# Patient Record
Sex: Male | Born: 1970 | Race: White | Hispanic: No | Marital: Married | Smoking: Never smoker
Health system: Southern US, Community
[De-identification: ages and names within clinical notes are randomized; demographics above are authoritative.]

## PROBLEM LIST (undated history)

## (undated) DIAGNOSIS — R51 Headache: Secondary | ICD-10-CM

## (undated) DIAGNOSIS — E785 Hyperlipidemia, unspecified: Secondary | ICD-10-CM

## (undated) DIAGNOSIS — K219 Gastro-esophageal reflux disease without esophagitis: Secondary | ICD-10-CM

## (undated) DIAGNOSIS — R519 Headache, unspecified: Secondary | ICD-10-CM

## (undated) DIAGNOSIS — G43909 Migraine, unspecified, not intractable, without status migrainosus: Secondary | ICD-10-CM

## (undated) HISTORY — DX: Gastro-esophageal reflux disease without esophagitis: K21.9

## (undated) HISTORY — PX: NO PAST SURGERIES: SHX2092

## (undated) HISTORY — DX: Headache, unspecified: R51.9

## (undated) HISTORY — DX: Headache: R51

## (undated) HISTORY — DX: Hyperlipidemia, unspecified: E78.5

## (undated) HISTORY — DX: Migraine, unspecified, not intractable, without status migrainosus: G43.909

---

## 2005-08-25 ENCOUNTER — Encounter: Admission: RE | Admit: 2005-08-25 | Discharge: 2005-08-25 | Payer: Self-pay | Admitting: Family Medicine

## 2008-06-19 ENCOUNTER — Ambulatory Visit: Payer: Self-pay | Admitting: Diagnostic Radiology

## 2008-06-19 ENCOUNTER — Ambulatory Visit (HOSPITAL_BASED_OUTPATIENT_CLINIC_OR_DEPARTMENT_OTHER): Admission: RE | Admit: 2008-06-19 | Discharge: 2008-06-19 | Payer: Self-pay | Admitting: Family Medicine

## 2011-12-01 ENCOUNTER — Other Ambulatory Visit: Payer: Self-pay | Admitting: Otolaryngology

## 2011-12-01 ENCOUNTER — Ambulatory Visit
Admission: RE | Admit: 2011-12-01 | Discharge: 2011-12-01 | Disposition: A | Discharge status: Home / Self Care | Payer: BC Managed Care – PPO | Source: Ambulatory Visit | Attending: Otolaryngology | Admitting: Otolaryngology

## 2011-12-01 DIAGNOSIS — K219 Gastro-esophageal reflux disease without esophagitis: Secondary | ICD-10-CM

## 2013-10-22 IMAGING — CR DG CERVICAL SPINE COMPLETE 4+V
8 series · 8 of 8 positions shown · non-contrast
Comparison: None.

CLINICAL DATA: 40-year-old male with pain on the right side.

CERVICAL SPINE - COMPLETE 4+ VIEW

[view not recorded (1 of 8)]
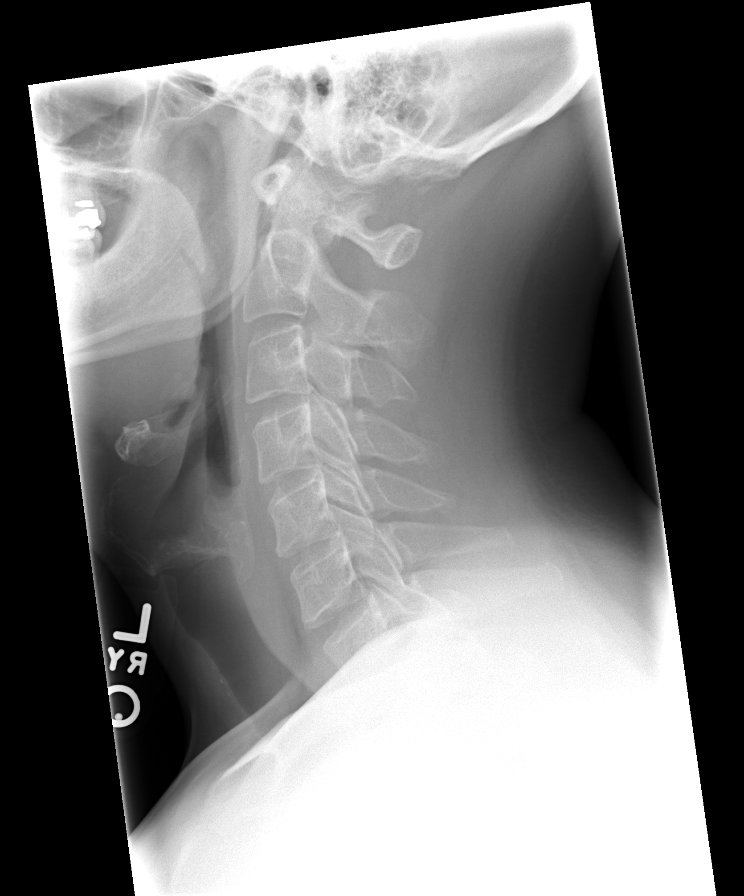

[view not recorded (2 of 8)]
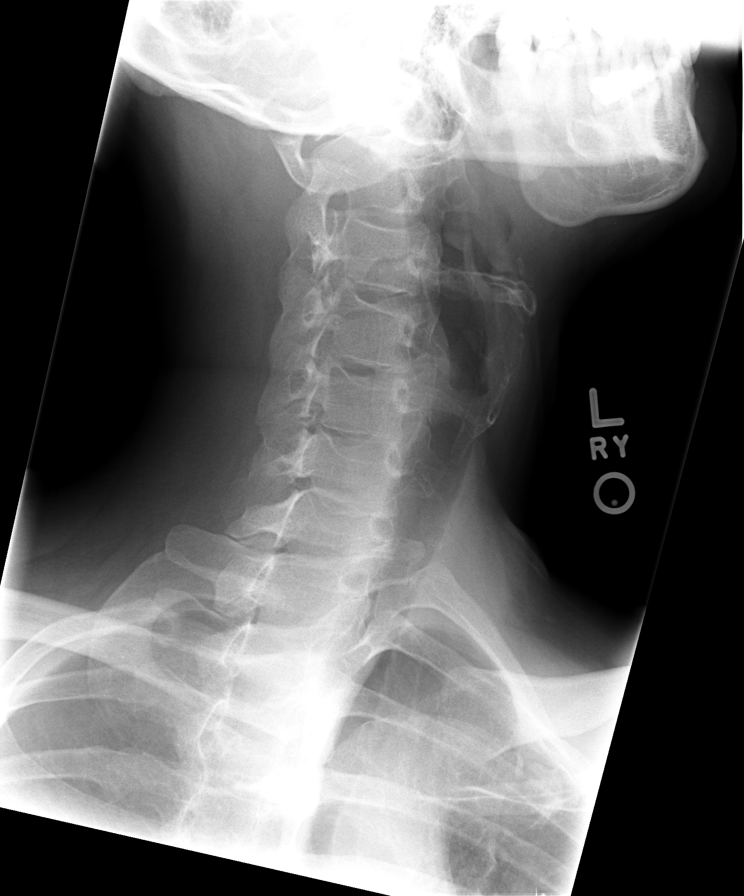

[view not recorded (3 of 8)]
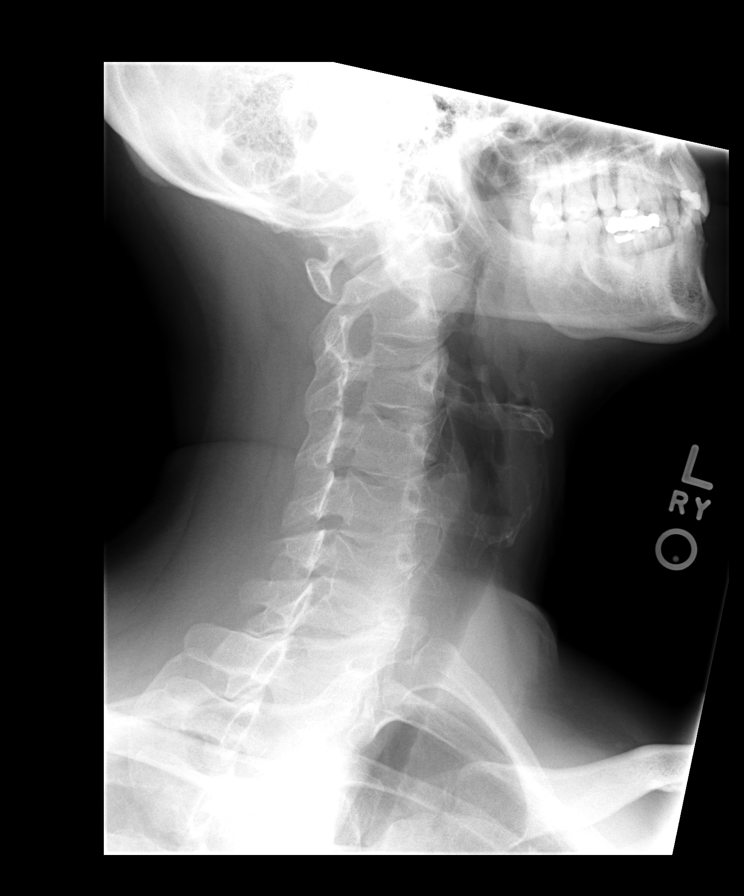

[view not recorded (4 of 8)]
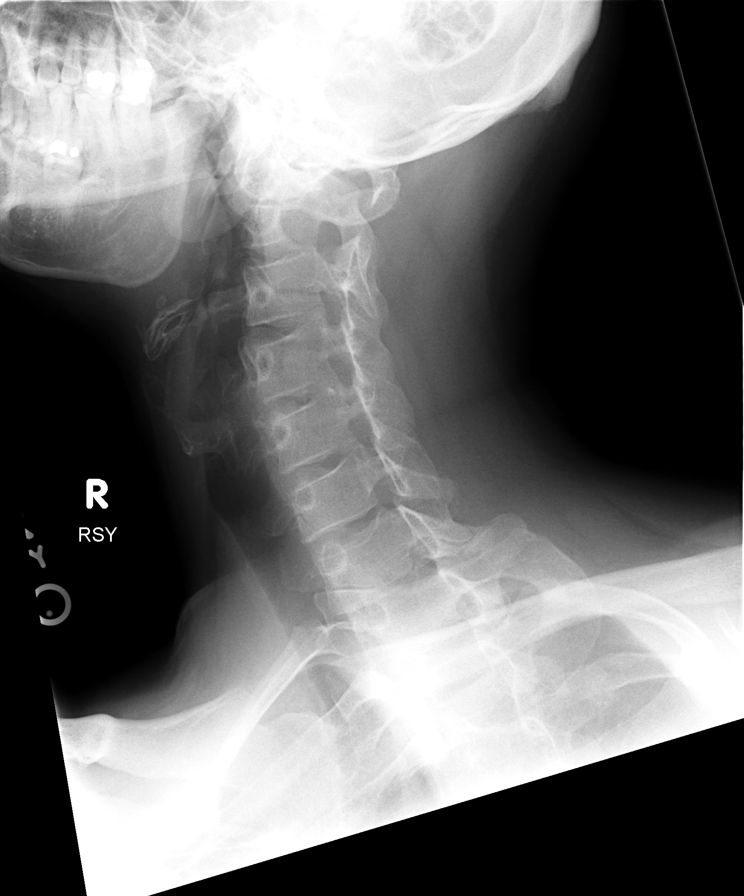

[view not recorded (5 of 8)]
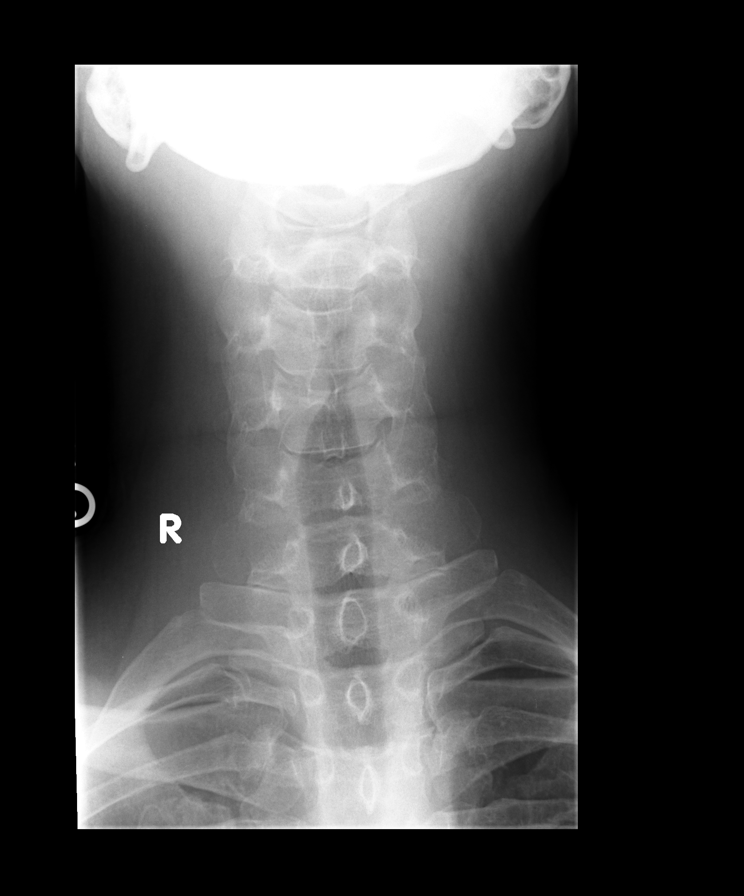

[view not recorded (6 of 8)]
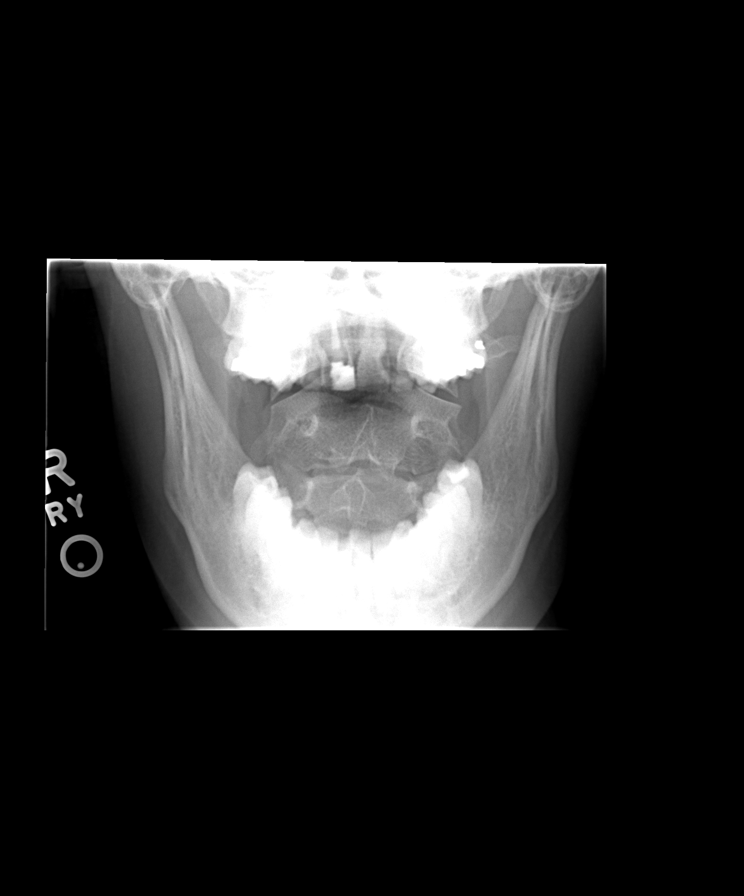

[view not recorded (7 of 8)]
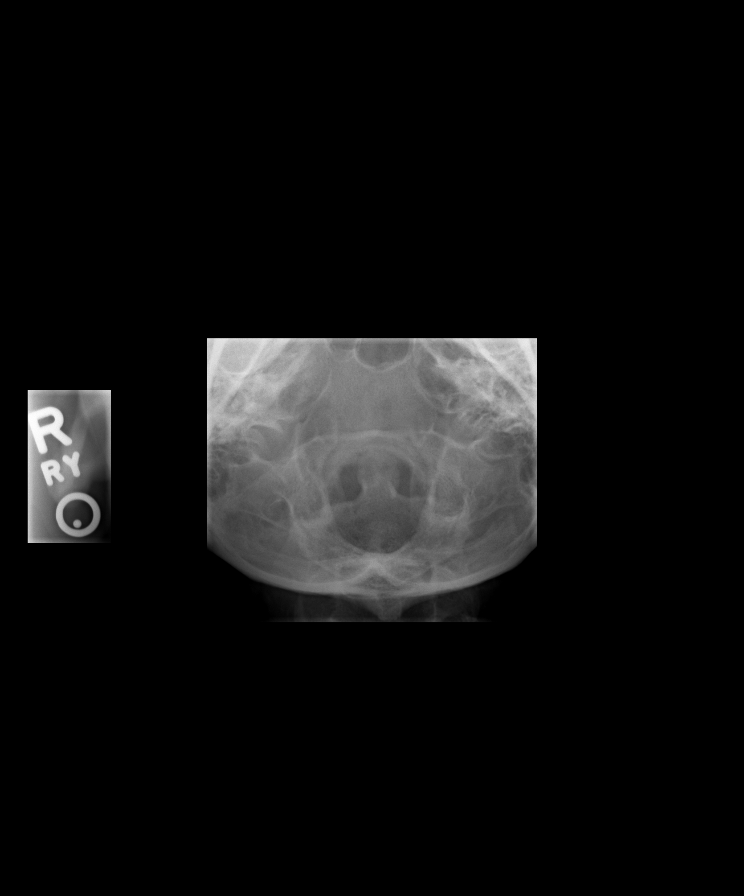

[view not recorded (8 of 8)]
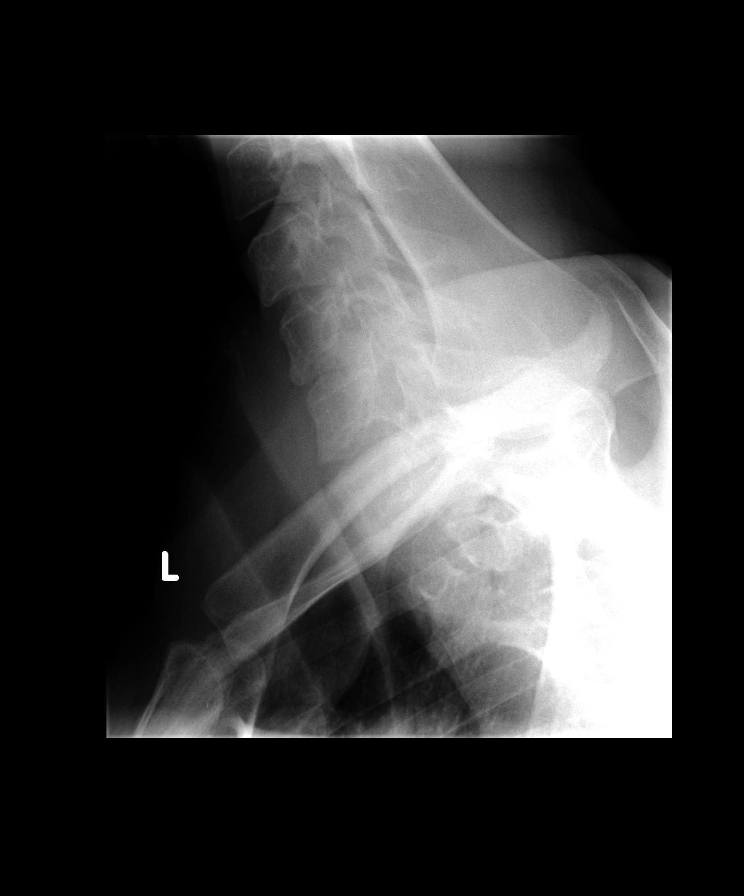

[8 of 8 positions shown; findings below may reference images not displayed]

FINDINGS: Preserved cervical lordosis.  Normal prevertebral soft
tissue contours.  Relatively preserved disc spaces. Bilateral
posterior element alignment is within normal limits.
Cervicothoracic junction alignment is within normal limits.  Lung
apices within normal limits.  AP alignment normal except for mild
leftward flexion of the upper cervical spine.  C1-C2 alignment and
odontoid within normal limits.
IMPRESSION: No acute osseous abnormality in the cervical spine.

## 2015-06-19 ENCOUNTER — Ambulatory Visit (HOSPITAL_COMMUNITY)
Admission: RE | Admit: 2015-06-19 | Discharge: 2015-06-19 | Disposition: A | Discharge status: Home / Self Care | Payer: BLUE CROSS/BLUE SHIELD | Source: Ambulatory Visit | Attending: Obstetrics and Gynecology | Admitting: Obstetrics and Gynecology

## 2015-06-19 DIAGNOSIS — Z31448 Encounter for other genetic testing of male for procreative management: Secondary | ICD-10-CM | POA: Diagnosis present

## 2015-06-19 LAB — ROUTINE CHROMOSOME - KARYOTYPE

## 2015-06-28 ENCOUNTER — Telehealth (HOSPITAL_COMMUNITY): Payer: Self-pay | Admitting: MS"

## 2015-06-28 NOTE — Telephone Encounter (Signed)
Peripheral blood chromosome analysis performed for Mr. Cesar Collier revealed apparently normal male chromosomes, 2746, XY. Testing performed given couple's pregnancy history. Results communicated to his wife, Mrs. Cesar Collier.   Cesar Collier 06/28/2015

## 2015-07-05 ENCOUNTER — Other Ambulatory Visit (HOSPITAL_COMMUNITY): Payer: Self-pay

## 2016-08-05 ENCOUNTER — Other Ambulatory Visit: Payer: Self-pay | Admitting: Orthopaedic Surgery

## 2016-08-05 DIAGNOSIS — Z77018 Contact with and (suspected) exposure to other hazardous metals: Secondary | ICD-10-CM

## 2016-08-05 DIAGNOSIS — M5136 Other intervertebral disc degeneration, lumbar region: Secondary | ICD-10-CM

## 2016-08-11 ENCOUNTER — Inpatient Hospital Stay
Admission: RE | Admit: 2016-08-11 | Discharge: 2016-08-11 | Disposition: A | Discharge status: Home / Self Care | Payer: BLUE CROSS/BLUE SHIELD | Source: Ambulatory Visit | Attending: Orthopaedic Surgery | Admitting: Orthopaedic Surgery

## 2016-08-11 ENCOUNTER — Other Ambulatory Visit: Payer: BLUE CROSS/BLUE SHIELD

## 2017-02-12 DIAGNOSIS — K5901 Slow transit constipation: Secondary | ICD-10-CM | POA: Diagnosis not present

## 2017-08-16 DIAGNOSIS — J101 Influenza due to other identified influenza virus with other respiratory manifestations: Secondary | ICD-10-CM | POA: Diagnosis not present

## 2017-08-16 DIAGNOSIS — R05 Cough: Secondary | ICD-10-CM | POA: Diagnosis not present

## 2017-08-16 DIAGNOSIS — J029 Acute pharyngitis, unspecified: Secondary | ICD-10-CM | POA: Diagnosis not present

## 2018-07-08 ENCOUNTER — Ambulatory Visit (INDEPENDENT_AMBULATORY_CARE_PROVIDER_SITE_OTHER): Payer: BLUE CROSS/BLUE SHIELD | Admitting: Family Medicine

## 2018-07-08 ENCOUNTER — Encounter: Payer: Self-pay | Admitting: Family Medicine

## 2018-07-08 VITALS — BP 120/80 | HR 74 | Temp 98.4°F | Ht 73.0 in | Wt 199.5 lb

## 2018-07-08 DIAGNOSIS — R0683 Snoring: Secondary | ICD-10-CM

## 2018-07-08 DIAGNOSIS — H9201 Otalgia, right ear: Secondary | ICD-10-CM

## 2018-07-08 DIAGNOSIS — Z0000 Encounter for general adult medical examination without abnormal findings: Secondary | ICD-10-CM

## 2018-07-08 DIAGNOSIS — K219 Gastro-esophageal reflux disease without esophagitis: Secondary | ICD-10-CM | POA: Diagnosis not present

## 2018-07-08 DIAGNOSIS — Z114 Encounter for screening for human immunodeficiency virus [HIV]: Secondary | ICD-10-CM

## 2018-07-08 LAB — COMPREHENSIVE METABOLIC PANEL
ALBUMIN: 4.2 g/dL (ref 3.5–5.2)
ALT: 31 U/L (ref 0–53)
AST: 24 U/L (ref 0–37)
Alkaline Phosphatase: 38 U/L — ABNORMAL LOW (ref 39–117)
BUN: 15 mg/dL (ref 6–23)
CALCIUM: 9 mg/dL (ref 8.4–10.5)
CHLORIDE: 104 meq/L (ref 96–112)
CO2: 29 meq/L (ref 19–32)
Creatinine, Ser: 0.95 mg/dL (ref 0.40–1.50)
GFR: 90.7 mL/min (ref >60.00)
Glucose, Bld: 92 mg/dL (ref 70–99)
Potassium: 4.1 mEq/L (ref 3.5–5.1)
SODIUM: 139 meq/L (ref 135–145)
Total Bilirubin: 0.5 mg/dL (ref 0.2–1.2)
Total Protein: 6.6 g/dL (ref 6.0–8.3)

## 2018-07-08 LAB — LIPID PANEL
CHOL/HDL RATIO: 4
CHOLESTEROL: 205 mg/dL — AB (ref 0–200)
HDL: 55.9 mg/dL (ref >39.00)
LDL CALC: 130 mg/dL — AB (ref 0–99)
NonHDL: 148.91
Triglycerides: 96 mg/dL (ref 0.0–149.0)
VLDL: 19.2 mg/dL (ref 0.0–40.0)

## 2018-07-08 MED ORDER — LEVOCETIRIZINE DIHYDROCHLORIDE 5 MG PO TABS: 5.0000 mg | ORAL_TABLET | Freq: Every evening | ORAL | 2 refills | Status: DC

## 2018-07-08 MED ORDER — MOMETASONE FUROATE 50 MCG/ACT NA SUSP: 2.0000 | Freq: Every day | NASAL | 12 refills | Status: DC

## 2018-07-08 NOTE — Progress Notes (Signed)
Pre visit review using our clinic review tool, if applicable. No additional management support is needed unless otherwise documented below in the visit note. 

## 2018-07-08 NOTE — Patient Instructions (Addendum)
Give us 2-3 business days to get the results of your labs back.   Keep the diet clean and stay active.  If you do not hear anything about your referral in the next 1-2 weeks, call our office and ask for an update.  Let us know if you need anything. 

## 2018-07-08 NOTE — Progress Notes (Signed)
Chief Complaint  Patient presents with  . New Patient (Initial Visit)    Well Male Cesar Collier is here for a complete physical.   His last physical was >1 year ago.  Current diet: in general, a "not horrible" diet.   Current exercise: Physically active at work Weight trend: lost 30 lbs Does pt snore? Yes. Daytime fatigue? Yes. Seat belt? Yes.    Health maintenance Tetanus- Yes HIV- No   +hx of snoring, not sig improved after losing 30 lbs. Wife usually kicks him out of bed, but there is concern that he has stopped breathing in his sleep. +daytime fatigue, but he is very busy with work.   +R ear pain, worse with cold weather. Has failed 2 mo of INCS. ENT initial eval was unremarkable, no helpful recs. Will sometimes have clear drainage on pillow. No bleeding. +congestion.  Past Medical History:  Diagnosis Date  . Headache   . Migraine      History reviewed. No pertinent surgical history.  Medications  Current Outpatient Medications on File Prior to Visit  Medication Sig Dispense Refill  . omeprazole (PRILOSEC) 20 MG capsule Take 20 mg by mouth daily.     Allergies No Known Allergies  Family History Family History  Problem Relation Age of Onset  . Diabetes Father   . Hypertension Father   . Alcohol abuse Maternal Grandfather   . Alzheimer's disease Paternal Grandmother   . Heart disease Paternal Grandfather   . Hyperlipidemia Paternal Grandfather    Review of Systems: Constitutional: no fevers or chills Eye:  no recent significant change in vision Ear/Nose/Mouth/Throat:  Ears:  +R ear pain Nose/Mouth/Throat:   no sore throat Cardiovascular:  no chest pain, no palpitations Respiratory:  no cough and no shortness of breath Gastrointestinal:  no abdominal pain, no change in bowel habits GU:  Male: negative for dysuria, frequency, and incontinence and negative for prostate symptoms Musculoskeletal/Extremities:  no pain, redness, or swelling of the  joints Integumentary (Skin/Breast): +dry patch; otherwise no abnormal skin lesions reported Neurologic:  no headaches, no numbness, tingling Endocrine: No unexpected weight changes Hematologic/Lymphatic:  no night sweats  Exam BP 120/80 (BP Location: Right Arm, Patient Position: Sitting, Cuff Size: Normal)   Pulse 74   Temp 98.4 F (36.9 C) (Oral)   Ht 6\' 1"  (1.854 m)   Wt 199 lb 8 oz (90.5 kg)   SpO2 98%   BMI 26.32 kg/m  General:  well developed, well nourished, in no apparent distress Skin: See below; otherwise no significant moles, warts, or growths Head:  no masses, lesions, or tenderness Eyes:  pupils equal and round, sclera anicteric without injection Ears:  canals without lesions, TMs shiny without retraction, no obvious effusion, no erythema Nose:  nares patent, septum midline, mucosa normal Throat/Pharynx:  lips and gingiva without lesion; tongue and uvula midline; non-inflamed pharynx; no exudates or postnasal drainage Neck: neck supple without adenopathy, thyromegaly, or masses Lungs:  clear to auscultation, breath sounds equal bilaterally, no respiratory distress Cardio:  regular rate and rhythm, no bruits, no LE edema Abdomen:  abdomen soft, nontender; bowel sounds normal; no masses or organomegaly Genital (male): Uncircumcised penis, no lesions or discharge; testes present bilaterally without masses or tenderness Rectal: Deferred Musculoskeletal:  symmetrical muscle groups noted without atrophy or deformity Extremities:  no clubbing, cyanosis, or edema, no deformities, no skin discoloration Neuro:  gait normal; deep tendon reflexes normal and symmetric Psych: well oriented with normal range of affect and appropriate judgment/insight  L collar bone  Assessment and Plan  Well adult exam - Plan: Comprehensive metabolic panel, Lipid panel  Gastroesophageal reflux disease, esophagitis presence not specified  Snoring - Plan: Ambulatory referral to  Pulmonology  Screening for HIV (human immunodeficiency virus) - Plan: HIV Antibody (routine testing w rflx)  Right ear pain   Well 48 y.o. male. Counseled on diet and exercise. Other orders as above. Refer to pulm/sleep for snoring/possible OSA. Trial INCS and PO antihistamine. If no improvement, will refer to ENT in area.  Monitor skin lesion. Will let us know if it changes. Follow up in 1 year pending the above workup. The patient voiced understanding and agreement to the plan.  Jilda Rocheicholas Paul AristesWendling, DO 07/08/18 1:49 PM

## 2018-07-09 LAB — HIV ANTIBODY (ROUTINE TESTING W REFLEX): HIV: NONREACTIVE

## 2018-10-13 ENCOUNTER — Institutional Professional Consult (permissible substitution): Payer: BLUE CROSS/BLUE SHIELD | Admitting: Pulmonary Disease

## 2019-07-12 ENCOUNTER — Encounter: Payer: BLUE CROSS/BLUE SHIELD | Admitting: Family Medicine

## 2019-07-19 ENCOUNTER — Encounter: Payer: BLUE CROSS/BLUE SHIELD | Admitting: Family Medicine

## 2019-08-28 ENCOUNTER — Encounter: Payer: Self-pay | Admitting: Internal Medicine

## 2019-08-28 ENCOUNTER — Other Ambulatory Visit: Payer: Self-pay

## 2019-08-28 ENCOUNTER — Ambulatory Visit: Payer: BC Managed Care – PPO | Admitting: Internal Medicine

## 2019-08-28 VITALS — BP 134/81 | HR 77 | Temp 96.8°F | Resp 16 | Ht 73.0 in | Wt 208.0 lb

## 2019-08-28 DIAGNOSIS — M542 Cervicalgia: Secondary | ICD-10-CM

## 2019-08-28 MED ORDER — PREDNISONE 10 MG PO TABS: ORAL_TABLET | ORAL | 0 refills | Status: DC

## 2019-08-28 MED ORDER — CYCLOBENZAPRINE HCL 10 MG PO TABS
10.0000 mg | ORAL_TABLET | Freq: Every evening | ORAL | 0 refills | Status: DC | PRN
Start: 2019-08-28 — End: 2021-07-22

## 2019-08-28 NOTE — Progress Notes (Signed)
   Subjective:    Patient ID: Cesar Collier, male    DOB: 25-Sep-1970, 49 y.o.   MRN: [id number]  DOS:  08/28/2019 Type of visit - description: Acute Symptoms started approximately a week ago with pain located at the right posterior neck. The pain is steady but worse with certain movements. He feels that is radiating upwards creating a right-sided headache. He also has some sinus pressure but denies any fever, chills, nasal discharge. Has taking OTCs without much success. No recent injury that he can recall.   Review of Systems Denies any rash on the head or neck No dizziness, nausea, diplopia.  Speech is normal.  No visual disturbances No cough  Past Medical History:  Diagnosis Date  . Headache   . Migraine     No past surgical history on file.  Allergies as of 08/28/2019      Reactions   Codeine Nausea And Vomiting      Medication List       Accurate as of August 28, 2019 11:44 AM. If you have any questions, ask your nurse or doctor.        levocetirizine 5 MG tablet Commonly known as: XYZAL Take 1 tablet (5 mg total) by mouth every evening.   mometasone 50 MCG/ACT nasal spray Commonly known as: NASONEX Place 2 sprays into the nose daily.   omeprazole 20 MG capsule Commonly known as: PRILOSEC Take 20 mg by mouth daily.             Objective:   Physical Exam Neck:     BP 134/81 (BP Location: Right Arm, Patient Position: Sitting, Cuff Size: Normal)   Pulse 77   Temp (!) 96.8 F (36 C) (Temporal)   Resp 16   Ht 6\' 1"  (1.854 m)   Wt 208 lb (94.3 kg)   SpO2 100%   BMI 27.44 kg/m  General:   Well developed, NAD, BMI noted. HEENT:  Normocephalic . Face symmetric, atraumatic. EOMI, pupils equal and reactive, no photophobia, anterior chambers normal. Neck: See graphic, range of motion is normal but he reports pain when he turns his head to the right. Lungs:  CTA B Normal respiratory effort, no intercostal retractions, no accessory muscle  use. Heart: RRR,  no murmur.  Lower extremities: no pretibial edema bilaterally  Skin: Not pale. Not jaundice Neurologic:  alert & oriented X3.  Speech normal, gait appropriate for age and unassisted Motor and DTRs symmetric Psych--  Cognition and judgment appear intact.  Cooperative with normal attention span and concentration.  Behavior appropriate. No anxious or depressed appearing.      Assessment    49 year old male, PMH includes allergies and GERD presents with:  Neck pain: Neck pain associated with likely a cervicogenic headache. No alarming or red flag symptoms. Exam is benign. Plan: Round of prednisone, Flexeril at night, alternate Tylenol and ibuprofen, GI precautions discussed. Call if not gradually better.   This visit occurred during the SARS-CoV-2 public health emergency.  Safety protocols were in place, including screening questions prior to the visit, additional usage of staff PPE, and extensive cleaning of exam room while observing appropriate contact time as indicated for disinfecting solutions.

## 2019-08-28 NOTE — Progress Notes (Signed)
Pre visit review using our clinic review tool, if applicable. No additional management support is needed unless otherwise documented below in the visit note. 

## 2019-08-28 NOTE — Patient Instructions (Signed)
Take prednisone, and anti-inflammatory as prescribed for few days. May cause insomnia  Flexeril, a muscle relaxant, as needed at bedtime  Heating pad  Alternate ibuprofen and Tylenol  IBUPROFEN : Always take it with food because may cause gastritis and ulcers.  If you notice nausea, stomach pain, change in the color of stools --->  Stop the medicine and let us know  Call if not gradually better  Call anytime if severe symptoms,   numbness or tingling anywhere or if you're not gradually better in the next 10 days

## 2019-11-03 ENCOUNTER — Ambulatory Visit: Payer: BC Managed Care – PPO | Admitting: Family Medicine

## 2019-11-03 ENCOUNTER — Encounter: Payer: Self-pay | Admitting: Family Medicine

## 2019-11-03 ENCOUNTER — Other Ambulatory Visit (INDEPENDENT_AMBULATORY_CARE_PROVIDER_SITE_OTHER): Payer: BC Managed Care – PPO

## 2019-11-03 ENCOUNTER — Other Ambulatory Visit: Payer: Self-pay | Admitting: Family Medicine

## 2019-11-03 ENCOUNTER — Other Ambulatory Visit: Payer: Self-pay

## 2019-11-03 VITALS — BP 128/72 | HR 88 | Temp 95.7°F | Ht 73.0 in | Wt 202.5 lb

## 2019-11-03 DIAGNOSIS — R7989 Other specified abnormal findings of blood chemistry: Secondary | ICD-10-CM | POA: Diagnosis not present

## 2019-11-03 DIAGNOSIS — R0683 Snoring: Secondary | ICD-10-CM | POA: Diagnosis not present

## 2019-11-03 DIAGNOSIS — E785 Hyperlipidemia, unspecified: Secondary | ICD-10-CM

## 2019-11-03 DIAGNOSIS — R739 Hyperglycemia, unspecified: Secondary | ICD-10-CM | POA: Diagnosis not present

## 2019-11-03 DIAGNOSIS — G8929 Other chronic pain: Secondary | ICD-10-CM

## 2019-11-03 DIAGNOSIS — R29898 Other symptoms and signs involving the musculoskeletal system: Secondary | ICD-10-CM

## 2019-11-03 DIAGNOSIS — M545 Low back pain, unspecified: Secondary | ICD-10-CM

## 2019-11-03 DIAGNOSIS — Z0000 Encounter for general adult medical examination without abnormal findings: Secondary | ICD-10-CM

## 2019-11-03 LAB — COMPREHENSIVE METABOLIC PANEL
ALT: 38 U/L (ref 0–53)
AST: 24 U/L (ref 0–37)
Albumin: 4.3 g/dL (ref 3.5–5.2)
Alkaline Phosphatase: 39 U/L (ref 39–117)
BUN: 13 mg/dL (ref 6–23)
CO2: 32 mEq/L (ref 19–32)
Calcium: 9.1 mg/dL (ref 8.4–10.5)
Chloride: 103 mEq/L (ref 96–112)
Creatinine, Ser: 1.01 mg/dL (ref 0.40–1.50)
GFR: 79.06 mL/min (ref >60.00)
Glucose, Bld: 108 mg/dL — ABNORMAL HIGH (ref 70–99)
Potassium: 4 mEq/L (ref 3.5–5.1)
Sodium: 140 mEq/L (ref 135–145)
Total Bilirubin: 0.5 mg/dL (ref 0.2–1.2)
Total Protein: 6.7 g/dL (ref 6.0–8.3)

## 2019-11-03 LAB — CBC
HCT: 41.9 % (ref 39.0–52.0)
Hemoglobin: 14.3 g/dL (ref 13.0–17.0)
MCHC: 34.1 g/dL (ref 30.0–36.0)
MCV: 91.6 fl (ref 78.0–100.0)
Platelets: 198 10*3/uL (ref 150.0–400.0)
RBC: 4.57 Mil/uL (ref 4.22–5.81)
RDW: 13 % (ref 11.5–15.5)
WBC: 4.9 10*3/uL (ref 4.0–10.5)

## 2019-11-03 LAB — LIPID PANEL
Cholesterol: 216 mg/dL — ABNORMAL HIGH (ref 0–200)
HDL: 47.5 mg/dL (ref >39.00)
LDL Cholesterol: 130 mg/dL — ABNORMAL HIGH (ref 0–99)
NonHDL: 168.3
Total CHOL/HDL Ratio: 5
Triglycerides: 193 mg/dL — ABNORMAL HIGH (ref 0.0–149.0)
VLDL: 38.6 mg/dL (ref 0.0–40.0)

## 2019-11-03 LAB — HEMOGLOBIN A1C: Hgb A1c MFr Bld: 5.6 % (ref 4.6–6.5)

## 2019-11-03 NOTE — Patient Instructions (Addendum)
Give [place] 2-3 business days to get the results of your labs back.   Keep the diet clean and stay active.  If you do not hear anything about your referral in the next 1-2 weeks, call our office and ask for an update.  Let us know if you need anything.  EXERCISES  RANGE OF MOTION (ROM) AND STRETCHING EXERCISES - Low Back Pain Most people with lower back pain will find that their symptoms get worse with excessive bending forward (flexion) or arching at the lower back (extension). The exercises that will help resolve your symptoms will focus on the opposite motion.  If you have pain, numbness or tingling which travels down into your buttocks, leg or foot, the goal of the therapy is for these symptoms to move closer to your back and eventually resolve. Sometimes, these leg symptoms will get better, but your lower back pain may worsen. This is often an indication of progress in your rehabilitation. Be very alert to any changes in your symptoms and the activities in which you participated in the 24 hours prior to the change. Sharing this information with your caregiver will allow him or her to most efficiently treat your condition. These exercises may help you when beginning to rehabilitate your injury. Your symptoms may resolve with or without further involvement from your physician, physical therapist or athletic trainer. While completing these exercises, remember:   Restoring tissue flexibility helps normal motion to return to the joints. This allows healthier, less painful movement and activity.  An effective stretch should be held for at least 30 seconds.  A stretch should never be painful. You should only feel a gentle lengthening or release in the stretched tissue. FLEXION RANGE OF MOTION AND STRETCHING EXERCISES:  STRETCH - Flexion, Single Knee to Chest   Lie on a firm bed or floor with both legs extended in front of you.  Keeping one leg in contact with the floor, bring your opposite knee to  your chest. Hold your leg in place by either grabbing behind your thigh or at your knee.  Pull until you feel a gentle stretch in your low back. Hold 30 seconds.  Slowly release your grasp and repeat the exercise with the opposite side. Repeat 2 times. Complete this exercise 3 times per week.   STRETCH - Flexion, Double Knee to Chest  Lie on a firm bed or floor with both legs extended in front of you.  Keeping one leg in contact with the floor, bring your opposite knee to your chest.  Tense your stomach muscles to support your back and then lift your other knee to your chest. Hold your legs in place by either grabbing behind your thighs or at your knees.  Pull both knees toward your chest until you feel a gentle stretch in your low back. Hold 30 seconds.  Tense your stomach muscles and slowly return one leg at a time to the floor. Repeat 2 times. Complete this exercise 3 times per week.   STRETCH - Low Trunk Rotation  Lie on a firm bed or floor. Keeping your legs in front of you, bend your knees so they are both pointed toward the ceiling and your feet are flat on the floor.  Extend your arms out to the side. This will stabilize your upper body by keeping your shoulders in contact with the floor.  Gently and slowly drop both knees together to one side until you feel a gentle stretch in your low back. Hold  for 30 seconds.  Tense your stomach muscles to support your lower back as you bring your knees back to the starting position. Repeat the exercise to the other side. Repeat 2 times. Complete this exercise at least 3 times per week.   EXTENSION RANGE OF MOTION AND FLEXIBILITY EXERCISES:  STRETCH - Extension, Prone on Elbows   Lie on your stomach on the floor, a bed will be too soft. Place your palms about shoulder width apart and at the height of your head.  Place your elbows under your shoulders. If this is too painful, stack pillows under your chest.  Allow your body to relax  so that your hips drop lower and make contact more completely with the floor.  Hold this position for 30 seconds.  Slowly return to lying flat on the floor. Repeat 2 times. Complete this exercise 3 times per week.   RANGE OF MOTION - Extension, Prone Press Ups  Lie on your stomach on the floor, a bed will be too soft. Place your palms about shoulder width apart and at the height of your head.  Keeping your back as relaxed as possible, slowly straighten your elbows while keeping your hips on the floor. You may adjust the placement of your hands to maximize your comfort. As you gain motion, your hands will come more underneath your shoulders.  Hold this position 30 seconds.  Slowly return to lying flat on the floor. Repeat 2 times. Complete this exercise 3 times per week.   RANGE OF MOTION- Quadruped, Neutral Spine   Assume a hands and knees position on a firm surface. Keep your hands under your shoulders and your knees under your hips. You may place padding under your knees for comfort.  Drop your head and point your tailbone toward the ground below you. This will round out your lower back like an angry cat. Hold this position for 30 seconds.  Slowly lift your head and release your tail bone so that your back sags into a large arch, like an old horse.  Hold this position for 30 seconds.  Repeat this until you feel limber in your low back.  Now, find your "sweet spot." This will be the most comfortable position somewhere between the two previous positions. This is your neutral spine. Once you have found this position, tense your stomach muscles to support your low back.  Hold this position for 30 seconds. Repeat 2 times. Complete this exercise 3 times per week.   STRENGTHENING EXERCISES - Low Back Sprain These exercises may help you when beginning to rehabilitate your injury. These exercises should be done near your "sweet spot." This is the neutral, low-back arch, somewhere between  fully rounded and fully arched, that is your least painful position. When performed in this safe range of motion, these exercises can be used for people who have either a flexion or extension based injury. These exercises may resolve your symptoms with or without further involvement from your physician, physical therapist or athletic trainer. While completing these exercises, remember:   Muscles can gain both the endurance and the strength needed for everyday activities through controlled exercises.  Complete these exercises as instructed by your physician, physical therapist or athletic trainer. Increase the resistance and repetitions only as guided.  You may experience muscle soreness or fatigue, but the pain or discomfort you are trying to eliminate should never worsen during these exercises. If this pain does worsen, stop and make certain you are following the directions exactly.  If the pain is still present after adjustments, discontinue the exercise until you can discuss the trouble with your caregiver.  STRENGTHENING - Deep Abdominals, Pelvic Tilt   Lie on a firm bed or floor. Keeping your legs in front of you, bend your knees so they are both pointed toward the ceiling and your feet are flat on the floor.  Tense your lower abdominal muscles to press your low back into the floor. This motion will rotate your pelvis so that your tail bone is scooping upwards rather than pointing at your feet or into the floor. With a gentle tension and even breathing, hold this position for 3 seconds. Repeat 2 times. Complete this exercise 3 times per week.   STRENGTHENING - Abdominals, Crunches   Lie on a firm bed or floor. Keeping your legs in front of you, bend your knees so they are both pointed toward the ceiling and your feet are flat on the floor. Cross your arms over your chest.  Slightly tip your chin down without bending your neck.  Tense your abdominals and slowly lift your trunk high enough to  just clear your shoulder blades. Lifting higher can put excessive stress on the lower back and does not further strengthen your abdominal muscles.  Control your return to the starting position. Repeat 2 times. Complete this exercise 3 times per week.   STRENGTHENING - Quadruped, Opposite UE/LE Lift   Assume a hands and knees position on a firm surface. Keep your hands under your shoulders and your knees under your hips. You may place padding under your knees for comfort.  Find your neutral spine and gently tense your abdominal muscles so that you can maintain this position. Your shoulders and hips should form a rectangle that is parallel with the floor and is not twisted.  Keeping your trunk steady, lift your right hand no higher than your shoulder and then your left leg no higher than your hip. Make sure you are not holding your breath. Hold this position for 30 seconds.  Continuing to keep your abdominal muscles tense and your back steady, slowly return to your starting position. Repeat with the opposite arm and leg. Repeat 2 times. Complete this exercise 3 times per week.   STRENGTHENING - Abdominals and Quadriceps, Straight Leg Raise   Lie on a firm bed or floor with both legs extended in front of you.  Keeping one leg in contact with the floor, bend the other knee so that your foot can rest flat on the floor.  Find your neutral spine, and tense your abdominal muscles to maintain your spinal position throughout the exercise.  Slowly lift your straight leg off the floor about 6 inches for a count of 3, making sure to not hold your breath.  Still keeping your neutral spine, slowly lower your leg all the way to the floor. Repeat this exercise with each leg 2 times. Complete this exercise 3 times per week.  POSTURE AND BODY MECHANICS CONSIDERATIONS - Low Back Sprain Keeping correct posture when sitting, standing or completing your activities will reduce the stress put on different body  tissues, allowing injured tissues a chance to heal and limiting painful experiences. The following are general guidelines for improved posture.  While reading these guidelines, remember:  The exercises prescribed by your provider will help you have the flexibility and strength to maintain correct postures.  The correct posture provides the best environment for your joints to work. All of your joints have less  wear and tear when properly supported by a spine with good posture. This means you will experience a healthier, less painful body.  Correct posture must be practiced with all of your activities, especially prolonged sitting and standing. Correct posture is as important when doing repetitive low-stress activities (typing) as it is when doing a single heavy-load activity (lifting).  RESTING POSITIONS Consider which positions are most painful for you when choosing a resting position. If you have pain with flexion-based activities (sitting, bending, stooping, squatting), choose a position that allows you to rest in a less flexed posture. You would want to avoid curling into a fetal position on your side. If your pain worsens with extension-based activities (prolonged standing, working overhead), avoid resting in an extended position such as sleeping on your stomach. Most people will find more comfort when they rest with their spine in a more neutral position, neither too rounded nor too arched. Lying on a non-sagging bed on your side with a pillow between your knees, or on your back with a pillow under your knees will often provide some relief. Keep in mind, being in any one position for a prolonged period of time, no matter how correct your posture, can still lead to stiffness.  PROPER SITTING POSTURE In order to minimize stress and discomfort on your spine, you must sit with correct posture. Sitting with good posture should be effortless for a healthy body. Returning to good posture is a gradual  process. Many people can work toward this most comfortably by using various supports until they have the flexibility and strength to maintain this posture on their own. When sitting with proper posture, your ears will fall over your shoulders and your shoulders will fall over your hips. You should use the back of the chair to support your upper back. Your lower back will be in a neutral position, just slightly arched. You may place a small pillow or folded towel at the base of your lower back for  support.  When working at a desk, create an environment that supports good, upright posture. Without extra support, muscles tire, which leads to excessive strain on joints and other tissues. Keep these recommendations in mind:  CHAIR:  A chair should be able to slide under your desk when your back makes contact with the back of the chair. This allows you to work closely.  The chair's height should allow your eyes to be level with the upper part of your monitor and your hands to be slightly lower than your elbows.  BODY POSITION  Your feet should make contact with the floor. If this is not possible, use a foot rest.  Keep your ears over your shoulders. This will reduce stress on your neck and low back.  INCORRECT SITTING POSTURES  If you are feeling tired and unable to assume a healthy sitting posture, do not slouch or slump. This puts excessive strain on your back tissues, causing more damage and pain. Healthier options include:  Using more support, like a lumbar pillow.  Switching tasks to something that requires you to be upright or walking.  Talking a brief walk.  Lying down to rest in a neutral-spine position.  PROLONGED STANDING WHILE SLIGHTLY LEANING FORWARD  When completing a task that requires you to lean forward while standing in one place for a long time, place either foot up on a stationary 2-4 inch high object to help maintain the best posture. When both feet are on the ground, the  lower  back tends to lose its slight inward curve. If this curve flattens (or becomes too large), then the back and your other joints will experience too much stress, tire more quickly, and can cause pain.  CORRECT STANDING POSTURES Proper standing posture should be assumed with all daily activities, even if they only take a few moments, like when brushing your teeth. As in sitting, your ears should fall over your shoulders and your shoulders should fall over your hips. You should keep a slight tension in your abdominal muscles to brace your spine. Your tailbone should point down to the ground, not behind your body, resulting in an over-extended swayback posture.   INCORRECT STANDING POSTURES  Common incorrect standing postures include a forward head, locked knees and/or an excessive swayback. WALKING Walk with an upright posture. Your ears, shoulders and hips should all line-up.  PROLONGED ACTIVITY IN A FLEXED POSITION When completing a task that requires you to bend forward at your waist or lean over a low surface, try to find a way to stabilize 3 out of 4 of your limbs. You can place a hand or elbow on your thigh or rest a knee on the surface you are reaching across. This will provide you more stability, so that your muscles do not tire as quickly. By keeping your knees relaxed, or slightly bent, you will also reduce stress across your lower back. CORRECT LIFTING TECHNIQUES  DO :  Assume a wide stance. This will provide you more stability and the opportunity to get as close as possible to the object which you are lifting.  Tense your abdominals to brace your spine. Bend at the knees and hips. Keeping your back locked in a neutral-spine position, lift using your leg muscles. Lift with your legs, keeping your back straight.  Test the weight of unknown objects before attempting to lift them.  Try to keep your elbows locked down at your sides in order get the best strength from your shoulders  when carrying an object.     Always ask for help when lifting heavy or awkward objects. INCORRECT LIFTING TECHNIQUES DO NOT:   Lock your knees when lifting, even if it is a small object.  Bend and twist. Pivot at your feet or move your feet when needing to change directions.  Assume that you can safely pick up even a paperclip without proper posture.

## 2019-11-03 NOTE — Progress Notes (Signed)
Chief Complaint  Patient presents with  . Annual Exam    needs referral for back and leg problems    Well Male Cesar Collier [id number] is here for a complete physical.   His last physical was >1 year ago.  Current diet: in general, a "healthy" diet.   Current exercise: swimming, active in yard, walking Weight trend: stable Daytime fatigue? No. Seat belt? Yes.    Health maintenance Tetanus- Yes HIV- Yes   Patient has a nearly 15-year history of chronic right lower back pain with radiation to his right lower extremity.  He has been having a sharp pain in his upper gluteal area with radiation down his leg.  He was also having numbness and weakness in this leg as well.  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  He has undergone physical therapy in the past.  He states MRI was too expensive during his most recent work-up.  He would like to see a specialist.  No bowel or bladder incontinence.  Past Medical History:  Diagnosis Date  . Headache   . Migraine     History reviewed. No pertinent surgical history.  Medications  Current Outpatient Medications on File Prior to Visit  Medication Sig Dispense Refill  . omeprazole (PRILOSEC) 20 MG capsule Take 20 mg by mouth daily.    . cyclobenzaprine (FLEXERIL) 10 MG tablet Take 1 tablet (10 mg total) by mouth at bedtime as needed for muscle spasms. (Patient not taking: Reported on 11/03/2019) 14 tablet 0   Allergies Allergies  Allergen Reactions  . Codeine Nausea And Vomiting    Family History Family History  Problem Relation Age of Onset  . Diabetes Father   . Hypertension Father   . Alcohol abuse Maternal Grandfather   . Alzheimer's disease Paternal Grandmother   . Heart disease Paternal Grandfather   . Hyperlipidemia Paternal Grandfather     Review of Systems: Constitutional: no fevers or chills Eye:  no recent significant change in  vision Ear/Nose/Mouth/Throat:  Ears:  no hearing loss Nose/Mouth/Throat:  no complaints of nasal congestion, no sore throat Cardiovascular:  no chest pain Respiratory:  no shortness of breath Gastrointestinal:  no abdominal pain, no change in bowel habits GU:  Male: negative for dysuria, frequency, and incontinence Musculoskeletal/Extremities:  + Back and right lower extremity Integumentary (Skin/Breast):  no abnormal skin lesions reported Neurologic:  + Right lower extremity weakness Endocrine: No unexpected weight changes Hematologic/Lymphatic:  no night sweats  Exam BP 128/72 (BP Location: Left Arm, Patient Position: Sitting, Cuff Size: Normal)   Pulse 88   Temp (!) 95.7 F (35.4 C) (Temporal)   Ht 6\' 1"  (1.854 m)   Wt 202 lb 8 oz (91.9 kg)   SpO2 96%   BMI 26.72 kg/m  General:  well developed, well nourished, in no apparent distress Skin:  no significant moles, warts, or growths Head:  no masses, lesions, or tenderness Eyes:  pupils equal and round, sclera anicteric without injection Ears:  canals without lesions, TMs shiny without retraction, no obvious effusion, no erythema Nose:  nares patent, septum midline, mucosa normal Throat/Pharynx:  lips and gingiva without lesion; tongue and uvula midline; non-inflamed pharynx; no exudates or postnasal drainage Neck: neck supple without adenopathy, thyromegaly, or masses Lungs:  clear to auscultation, breath sounds equal bilaterally, no respiratory distress Cardio:  regular rate and rhythm, no bruits, no LE edema Abdomen:  abdomen soft, nontender; bowel  sounds normal; no masses or organomegaly Rectal: Deferred Musculoskeletal:  Mild ttp over the glute med on R; no ttp over midline or parasp msc b/l in lumbar spine Extremities:  no clubbing, cyanosis, or edema, no deformities, no skin discoloration Neuro:  gait antalgic; deep tendon reflexes brisk though symmetric and wo clonus Psych: well oriented with normal range of affect and  appropriate judgment/insight  Assessment and Plan  Well adult exam - Plan: CBC, Comprehensive metabolic panel, Lipid panel  Chronic right-sided low back pain, unspecified whether sciatica present - Plan: Ambulatory referral to Neurosurgery  Right leg weakness - Plan: Ambulatory referral to Neurosurgery   Well 49 y.o. male. Counseled on diet and exercise. Counseled on risks and benefits of prostate cancer screening with PSA. The patient agrees to forego screening.  Other orders as above. Declined PT/MRI, preferred referral. NS will be able to operate or proceed w more conserv care so will start there.  Follow up in 1 year pending the above workup. The patient voiced understanding and agreement to the plan.  [place], DO 11/03/19 9:29 AM

## 2019-11-14 DIAGNOSIS — M5416 Radiculopathy, lumbar region: Secondary | ICD-10-CM | POA: Diagnosis not present

## 2019-11-23 DIAGNOSIS — Z135 Encounter for screening for eye and ear disorders: Secondary | ICD-10-CM | POA: Diagnosis not present

## 2019-11-23 DIAGNOSIS — M5416 Radiculopathy, lumbar region: Secondary | ICD-10-CM | POA: Diagnosis not present

## 2019-11-23 DIAGNOSIS — M5137 Other intervertebral disc degeneration, lumbosacral region: Secondary | ICD-10-CM | POA: Diagnosis not present

## 2019-11-24 DIAGNOSIS — M5416 Radiculopathy, lumbar region: Secondary | ICD-10-CM | POA: Diagnosis not present

## 2020-01-01 DIAGNOSIS — M5416 Radiculopathy, lumbar region: Secondary | ICD-10-CM | POA: Diagnosis not present

## 2020-02-02 ENCOUNTER — Other Ambulatory Visit (INDEPENDENT_AMBULATORY_CARE_PROVIDER_SITE_OTHER): Payer: BC Managed Care – PPO

## 2020-02-02 ENCOUNTER — Other Ambulatory Visit: Payer: Self-pay

## 2020-02-02 DIAGNOSIS — E785 Hyperlipidemia, unspecified: Secondary | ICD-10-CM | POA: Diagnosis not present

## 2020-02-02 LAB — LIPID PANEL
Cholesterol: 208 mg/dL — ABNORMAL HIGH (ref 0–200)
HDL: 49.3 mg/dL (ref >39.00)
LDL Cholesterol: 135 mg/dL — ABNORMAL HIGH (ref 0–99)
NonHDL: 159.08
Total CHOL/HDL Ratio: 4
Triglycerides: 118 mg/dL (ref 0.0–149.0)
VLDL: 23.6 mg/dL (ref 0.0–40.0)

## 2020-10-12 ENCOUNTER — Other Ambulatory Visit: Payer: Self-pay

## 2020-10-12 ENCOUNTER — Ambulatory Visit
Admission: EM | Admit: 2020-10-12 | Discharge: 2020-10-12 | Disposition: A | Discharge status: Home / Self Care | Payer: BC Managed Care – PPO | Attending: Family Medicine | Admitting: Family Medicine

## 2020-10-12 DIAGNOSIS — B029 Zoster without complications: Secondary | ICD-10-CM | POA: Diagnosis not present

## 2020-10-12 MED ORDER — CAPSAICIN 0.075 % EX CREA: 1.0000 "application " | TOPICAL_CREAM | Freq: Two times a day (BID) | CUTANEOUS | 0 refills | Status: DC | PRN

## 2020-10-12 MED ORDER — VALACYCLOVIR HCL 1 G PO TABS: 1000.0000 mg | ORAL_TABLET | Freq: Three times a day (TID) | ORAL | 0 refills | Status: DC

## 2020-10-12 MED ORDER — IBUPROFEN 800 MG PO TABS: 800.0000 mg | ORAL_TABLET | Freq: Three times a day (TID) | ORAL | 0 refills | Status: DC

## 2020-10-12 NOTE — ED Triage Notes (Signed)
Pt presents with rash on back and left flank area for past few days

## 2020-10-12 NOTE — ED Provider Notes (Signed)
  [facility]   [id number] 10/12/20 Arrival Time: 1016  ASSESSMENT & PLAN:  1. Herpes zoster without complication    No sign of bacterial skin infection.  Begin: Meds ordered this encounter  Medications  . valACYclovir (VALTREX) 1000 MG tablet    Sig: Take 1 tablet (1,000 mg total) by mouth 3 (three) times daily.    Dispense:  21 tablet    Refill:  0  . capsicum (ZOSTRIX) 0.075 % topical cream    Sig: Apply 1 application topically 2 (two) times daily as needed.    Dispense:  28.3 g    Refill:  0  . ibuprofen (ADVIL) 800 MG tablet    Sig: Take 1 tablet (800 mg total) by mouth 3 (three) times daily with meals.    Dispense:  21 tablet    Refill:  0    Will follow up with PCP or here if worsening or failing to improve as anticipated. Reviewed expectations re: course of current medical issues. Questions answered. Outlined signs and symptoms indicating need for more acute intervention. Patient verbalized understanding. After Visit Summary given.   SUBJECTIVE:  Cesar Collier is a 50 y.o. male who presents with a skin complaint. Questions insect bite. "Felt a stinging sensation a few days ago then saw a rash". Afebrile. No h/o similar. No recent illnesses. No OTC tx.   OBJECTIVE: Vitals:   10/12/20 1058  BP: 119/77  Pulse: 66  Resp: 20  Temp: 98.5 F (36.9 C)  TempSrc: Oral  SpO2: 98%    General appearance: alert; no distress HEENT: [place]; AT Neck: supple with FROM Extremities: no edema; moves all extremities normally Skin: warm and dry; crops of red/purplish papules over L mid back with some on L chest; no signs of infection Psychological: alert and cooperative; normal mood and affect  Allergies  Allergen Reactions  . Codeine Nausea And Vomiting    Past Medical History:  Diagnosis Date  . Headache   . Migraine    Social History   Socioeconomic History  . Marital status: Married    Spouse name: Not on file  . Number of children: Not on file   . Years of education: Not on file  . Highest education level: Not on file  Occupational History  . Not on file  Tobacco Use  . Smoking status: Never Smoker  . Smokeless tobacco: Never Used  Substance and Sexual Activity  . Alcohol use: Never  . Drug use: Never  . Sexual activity: Not on file  Other Topics Concern  . Not on file  Social History Narrative  . Not on file   Social Determinants of Health   Financial Resource Strain: Not on file  Food Insecurity: Not on file  Transportation Needs: Not on file  Physical Activity: Not on file  Stress: Not on file  Social Connections: Not on file  Intimate Partner Violence: Not on file   Family History  Problem Relation Age of Onset  . Diabetes Father   . Hypertension Father   . Alcohol abuse Maternal Grandfather   . Alzheimer's disease Paternal Grandmother   . Heart disease Paternal Grandfather   . Hyperlipidemia Paternal Grandfather    No past surgical history on file.   Mardella Layman, MD 10/12/20 1345

## 2021-04-27 DIAGNOSIS — T1501XA Foreign body in cornea, right eye, initial encounter: Secondary | ICD-10-CM | POA: Diagnosis not present

## 2021-04-27 DIAGNOSIS — H5789 Other specified disorders of eye and adnexa: Secondary | ICD-10-CM | POA: Diagnosis not present

## 2021-04-28 DIAGNOSIS — T1501XA Foreign body in cornea, right eye, initial encounter: Secondary | ICD-10-CM | POA: Diagnosis not present

## 2021-07-22 ENCOUNTER — Ambulatory Visit (INDEPENDENT_AMBULATORY_CARE_PROVIDER_SITE_OTHER): Payer: BC Managed Care – PPO | Admitting: Family Medicine

## 2021-07-22 ENCOUNTER — Encounter: Payer: Self-pay | Admitting: Family Medicine

## 2021-07-22 VITALS — BP 110/68 | HR 83 | Temp 98.3°F | Wt 205.0 lb

## 2021-07-22 DIAGNOSIS — Z1211 Encounter for screening for malignant neoplasm of colon: Secondary | ICD-10-CM | POA: Diagnosis not present

## 2021-07-22 DIAGNOSIS — Z0000 Encounter for general adult medical examination without abnormal findings: Secondary | ICD-10-CM | POA: Diagnosis not present

## 2021-07-22 DIAGNOSIS — Z1159 Encounter for screening for other viral diseases: Secondary | ICD-10-CM

## 2021-07-22 NOTE — Patient Instructions (Addendum)
Give us 2-3 business days to get the results of your labs back.   Keep the diet clean and stay active.  If you do not hear anything about your referral in the next 1-2 weeks, call our office and ask for an update.  Let us know if you need anything. 

## 2021-07-22 NOTE — Progress Notes (Signed)
Chief Complaint  Patient presents with   Annual Exam    Well Male Cesar Collier [name] is here for a complete physical.   His last physical was >1 year ago.  Current diet: in general, healthy overall diet.   Current exercise: active at work Weight trend: stable Fatigue out of ordinary? No. Seat belt? Yes.   Advanced directive? No  Health maintenance Tetanus- Yes HIV- Yes Hep C- No  Past Medical History:  Diagnosis Date   Headache    Migraine      History reviewed. No pertinent surgical history.  Medications  Current Outpatient Medications on File Prior to Visit  Medication Sig Dispense Refill   capsicum (ZOSTRIX) 0.075 % topical cream Apply 1 application topically 2 (two) times daily as needed. 28.3 g 0   cyclobenzaprine (FLEXERIL) 10 MG tablet Take 1 tablet (10 mg total) by mouth at bedtime as needed for muscle spasms. 14 tablet 0   ibuprofen (ADVIL) 800 MG tablet Take 1 tablet (800 mg total) by mouth 3 (three) times daily with meals. 21 tablet 0   omeprazole (PRILOSEC) 20 MG capsule Take 20 mg by mouth daily.     valACYclovir (VALTREX) 1000 MG tablet Take 1 tablet (1,000 mg total) by mouth 3 (three) times daily. 21 tablet 0    Allergies Allergies  Allergen Reactions   Codeine Nausea And Vomiting    Family History Family History  Problem Relation Age of Onset   Diabetes Father    Hypertension Father    Alcohol abuse Maternal Grandfather    Alzheimer's disease Paternal Grandmother    Heart disease Paternal Grandfather    Hyperlipidemia Paternal Grandfather     Review of Systems: Constitutional: no fevers or chills Eye:  no recent significant change in vision Ear/Nose/Mouth/Throat:  Ears:  no hearing loss Nose/Mouth/Throat:  no complaints of nasal congestion, no sore throat Cardiovascular:  no chest pain Respiratory:  no shortness of breath Gastrointestinal:  no abdominal pain, no change in bowel habits GU:  Male: negative for dysuria, frequency, and  incontinence Musculoskeletal/Extremities:  no pain of the joints Integumentary (Skin/Breast):  no abnormal skin lesions reported Neurologic:  + headaches Endocrine: No unexpected weight changes Hematologic/Lymphatic:  no night sweats  Exam BP 110/68    Pulse 83    Temp 98.3 F (36.8 C) (Oral)    Wt 205 lb (93 kg)    SpO2 98%    BMI 27.05 kg/m  General:  well developed, well nourished, in no apparent distress Skin:  no significant moles, warts, or growths Head:  no masses, lesions, or tenderness Eyes:  pupils equal and round, sclera anicteric without injection Ears:  canals without lesions, TMs shiny without retraction, no obvious effusion, no erythema Nose:  nares patent, septum midline, mucosa normal Throat/Pharynx:  lips and gingiva without lesion; tongue and uvula midline; non-inflamed pharynx; no exudates or postnasal drainage Neck: neck supple without adenopathy, thyromegaly, or masses Lungs:  clear to auscultation, breath sounds equal bilaterally, no respiratory distress Cardio:  regular rate and rhythm, no bruits, no LE edema Abdomen:  abdomen soft, nontender; bowel sounds normal; no masses or organomegaly Rectal: Deferred Musculoskeletal:  symmetrical muscle groups noted without atrophy or deformity Extremities:  no clubbing, cyanosis, or edema, no deformities, no skin discoloration Neuro:  gait normal; deep tendon reflexes normal and symmetric Psych: well oriented with normal range of affect and appropriate judgment/insight  Assessment and Plan  Well adult exam - Plan: CBC, Comprehensive metabolic panel, Lipid panel  Encounter for hepatitis C screening test for low risk patient - Plan: Hepatitis C antibody  Screening for colon cancer - Plan: Ambulatory referral to Gastroenterology   Well 51 y.o. male. Counseled on diet and exercise. Advanced directive form provided today.  Refer GI for CCS.  Other orders as above. Follow up in 1 yr pending the above workup. The  patient voiced understanding and agreement to the plan.  Jilda Roche [place], DO 07/22/21 1:58 PM

## 2021-07-23 LAB — COMPREHENSIVE METABOLIC PANEL
ALT: 46 U/L (ref 0–53)
AST: 27 U/L (ref 0–37)
Albumin: 4.3 g/dL (ref 3.5–5.2)
Alkaline Phosphatase: 39 U/L (ref 39–117)
BUN: 16 mg/dL (ref 6–23)
CO2: 30 mEq/L (ref 19–32)
Calcium: 9.3 mg/dL (ref 8.4–10.5)
Chloride: 102 mEq/L (ref 96–112)
Creatinine, Ser: 1.03 mg/dL (ref 0.40–1.50)
GFR: 85.55 mL/min (ref >60.00)
Glucose, Bld: 92 mg/dL (ref 70–99)
Potassium: 4.7 mEq/L (ref 3.5–5.1)
Sodium: 140 mEq/L (ref 135–145)
Total Bilirubin: 0.5 mg/dL (ref 0.2–1.2)
Total Protein: 7 g/dL (ref 6.0–8.3)

## 2021-07-23 LAB — LIPID PANEL
Cholesterol: 235 mg/dL — ABNORMAL HIGH (ref 0–200)
HDL: 49.8 mg/dL (ref >39.00)
LDL Cholesterol: 153 mg/dL — ABNORMAL HIGH (ref 0–99)
NonHDL: 185.27
Total CHOL/HDL Ratio: 5
Triglycerides: 159 mg/dL — ABNORMAL HIGH (ref 0.0–149.0)
VLDL: 31.8 mg/dL (ref 0.0–40.0)

## 2021-07-23 LAB — HEPATITIS C ANTIBODY
Hepatitis C Ab: NONREACTIVE
SIGNAL TO CUT-OFF: <0.02 (ref <1.00)

## 2021-07-23 LAB — CBC
HCT: 42.2 % (ref 39.0–52.0)
Hemoglobin: 14.4 g/dL (ref 13.0–17.0)
MCHC: 34.1 g/dL (ref 30.0–36.0)
MCV: 92.3 fl (ref 78.0–100.0)
Platelets: 225 10*3/uL (ref 150.0–400.0)
RBC: 4.57 Mil/uL (ref 4.22–5.81)
RDW: 12.8 % (ref 11.5–15.5)
WBC: 5.5 10*3/uL (ref 4.0–10.5)

## 2021-07-29 ENCOUNTER — Encounter: Payer: Self-pay | Admitting: Family Medicine

## 2021-07-30 ENCOUNTER — Other Ambulatory Visit: Payer: Self-pay | Admitting: Family Medicine

## 2021-07-30 DIAGNOSIS — E785 Hyperlipidemia, unspecified: Secondary | ICD-10-CM

## 2021-07-30 MED ORDER — ROSUVASTATIN CALCIUM 20 MG PO TABS: 20.0000 mg | ORAL_TABLET | Freq: Every day | ORAL | 3 refills | Status: DC

## 2021-10-22 ENCOUNTER — Encounter: Payer: Self-pay | Admitting: Internal Medicine

## 2021-11-12 ENCOUNTER — Ambulatory Visit (AMBULATORY_SURGERY_CENTER): Payer: BC Managed Care – PPO | Admitting: *Deleted

## 2021-11-12 VITALS — Ht 73.0 in | Wt 205.0 lb

## 2021-11-12 DIAGNOSIS — Z1211 Encounter for screening for malignant neoplasm of colon: Secondary | ICD-10-CM

## 2021-11-12 NOTE — Progress Notes (Signed)
No egg or soy allergy known to patient  ?No issues known to pt with past sedation with any surgeries or procedures ?Patient denies ever being told they had issues or difficulty with intubation  ?No FH of Malignant Hyperthermia ?Pt is not on diet pills ?Pt is not on  home 02  ?Pt is not on blood thinners  ?Pt denies issues with constipation  ?No A fib or A flutter ? ? ?PV completed over the phone. Pt verified name, DOB, address and insurance during PV today.  ?Pt mailed instruction packet with copy of consent form to read and not return, and instructions.  ?Pt encouraged to call with questions or issues.  ?If pt has My chart, procedure instructions sent via My Chart  ?Insurance confirmed with pt at PV today   ?

## 2021-11-17 ENCOUNTER — Telehealth: Payer: Self-pay | Admitting: Internal Medicine

## 2021-11-17 NOTE — Telephone Encounter (Signed)
Patient called to reschedule his procedure is now scheduled in the afternoon so he requested new prep instructions. ?

## 2021-11-17 NOTE — Telephone Encounter (Signed)
New prep instructions sent to pt's Mychart-per request. ?

## 2021-12-02 ENCOUNTER — Encounter: Payer: Self-pay | Admitting: Internal Medicine

## 2021-12-03 ENCOUNTER — Encounter: Payer: BC Managed Care – PPO | Admitting: Internal Medicine

## 2021-12-09 ENCOUNTER — Encounter: Payer: Self-pay | Admitting: Internal Medicine

## 2021-12-09 ENCOUNTER — Ambulatory Visit: Payer: BC Managed Care – PPO | Admitting: Internal Medicine

## 2021-12-09 VITALS — BP 113/76 | HR 70 | Temp 98.6°F | Resp 11 | Ht 73.0 in | Wt 205.0 lb

## 2021-12-09 DIAGNOSIS — Z1211 Encounter for screening for malignant neoplasm of colon: Secondary | ICD-10-CM

## 2021-12-09 MED ORDER — SODIUM CHLORIDE 0.9 % IV SOLN: 500.0000 mL | Freq: Once | INTRAVENOUS | Status: DC

## 2021-12-09 NOTE — Progress Notes (Signed)
No problems noted in the recovery room. maw 

## 2021-12-09 NOTE — Progress Notes (Signed)
Report to pacu rn; vss. ?

## 2021-12-09 NOTE — Progress Notes (Signed)
[facility] History and Physical   Primary Care Physician:  Sharlene Dory, DO   Reason for Procedure:   CRCA screening  Plan:    colonoscopy     HPI: Cesar Collier [id number] is a 51 y.o. male here for screening   Past Medical History:  Diagnosis Date   GERD (gastroesophageal reflux disease)    on omeprazole   Headache    Hyperlipidemia    on statin   Migraine     History reviewed. No pertinent surgical history.  Prior to Admission medications   Medication Sig Start Date End Date Taking? Authorizing Provider  ibuprofen (ADVIL) 200 MG tablet Take 200 mg by mouth every 6 (six) hours as needed.   Yes [provider]  omeprazole (PRILOSEC) 20 MG capsule Take 20 mg by mouth daily.   Yes [provider]  rosuvastatin (CRESTOR) 20 MG tablet Take 1 tablet (20 mg total) by mouth daily. 07/30/21  Yes Sharlene Dory, DO    Current Outpatient Medications  Medication Sig Dispense Refill   ibuprofen (ADVIL) 200 MG tablet Take 200 mg by mouth every 6 (six) hours as needed.     omeprazole (PRILOSEC) 20 MG capsule Take 20 mg by mouth daily.     rosuvastatin (CRESTOR) 20 MG tablet Take 1 tablet (20 mg total) by mouth daily. 90 tablet 3   Current Facility-Administered Medications  Medication Dose Route Frequency Provider Last Rate Last Admin   0.9 %  sodium chloride infusion  500 mL Intravenous Once Iva Boop, MD        Allergies as of 12/09/2021 - Review Complete 12/09/2021  Allergen Reaction Noted   Codeine Nausea And Vomiting 04/06/2011    Family History  Problem Relation Age of Onset   Diabetes Father    Hypertension Father    Alcohol abuse Maternal Grandfather    Alzheimer's disease Paternal Grandmother    Heart disease Paternal Grandfather    Hyperlipidemia Paternal Grandfather    Colon polyps Neg Hx    Colon cancer Neg Hx    Esophageal cancer Neg Hx    Rectal cancer Neg Hx    Stomach cancer Neg Hx     Social History    Socioeconomic History   Marital status: Married    Spouse name: Not on file   Number of children: Not on file   Years of education: Not on file   Highest education level: Not on file  Occupational History   Not on file  Tobacco Use   Smoking status: Never   Smokeless tobacco: Never  Vaping Use   Vaping Use: Never used  Substance and Sexual Activity   Alcohol use: Yes    Comment: rare   Drug use: Never   Sexual activity: Not on file  Other Topics Concern   Not on file  Social History Narrative   Not on file   Social Determinants of Health   Financial Resource Strain: Not on file  Food Insecurity: Not on file  Transportation Needs: Not on file  Physical Activity: Not on file  Stress: Not on file  Social Connections: Not on file  Intimate Partner Violence: Not on file    Review of Systems:  All other review of systems negative except as mentioned in the HPI.  Physical Exam: Vital signs BP (!) 132/97   Pulse 80   Temp 98.6 F (37 C) (Temporal)   Ht 6\' 1"  (1.854 m)   Wt 205 lb (93 kg)  SpO2 99%   BMI 27.05 kg/m   General:   Alert,  Well-developed, well-nourished, pleasant and cooperative in NAD Lungs:  Clear throughout to auscultation.   Heart:  Regular rate and rhythm; no murmurs, clicks, rubs,  or gallops. Abdomen:  Soft, nontender and nondistended. Normal bowel sounds.   Neuro/Psych:  Alert and cooperative. Normal mood and affect. A and O x 3   @Shannah Conteh  , MD, [facility] [phone] (pager) 12/09/2021 2:11 PM@

## 2021-12-09 NOTE — Progress Notes (Signed)
I have reviewed the patient's medical history in detail and updated the computerized patient record.

## 2021-12-09 NOTE — Op Note (Signed)
[facility] Patient Name: Cesar Collier Procedure Date: 12/09/2021 2:10 PM MRN: [id number] Endoscopist: Iva Boop , MD Age: 51 Referring MD:  Date of Birth: August 12, 1970 Gender: Male Account #: [id number] Procedure:                Colonoscopy Indications:              Screening for colorectal malignant neoplasm, This                            is the patient's first colonoscopy Medicines:                Monitored Anesthesia Care Procedure:                Pre-Anesthesia Assessment:                           - Prior to the procedure, a History and Physical                            was performed, and patient medications and                            allergies were reviewed. The patient's tolerance of                            previous anesthesia was also reviewed. The risks                            and benefits of the procedure and the sedation                            options and risks were discussed with the patient.                            All questions were answered, and informed consent                            was obtained. Prior Anticoagulants: The patient has                            taken no previous anticoagulant or antiplatelet                            agents. ASA Grade Assessment: I - A normal, healthy                            patient. After reviewing the risks and benefits,                            the patient was deemed in satisfactory condition to                            undergo the procedure.  After obtaining informed consent, the colonoscope                            was passed under direct vision. Throughout the                            procedure, the patient's blood pressure, pulse, and                            oxygen saturations were monitored continuously. The                            CF [id number] #[id number] was introduced through the anus                            and advanced to the the cecum,  identified by                            appendiceal orifice and ileocecal valve. The                            colonoscopy was performed without difficulty. The                            patient tolerated the procedure well. The quality                            of the bowel preparation was good. The ileocecal                            valve, appendiceal orifice, and rectum were                            photographed. The bowel preparation used was                            Miralax via split dose instruction. Scope In: 2:16:41 PM Scope Out: 2:29:58 PM Scope Withdrawal Time: 0 hours 9 minutes 5 seconds  Total Procedure Duration: 0 hours 13 minutes 17 seconds  Findings:                 The perianal and digital rectal examinations were                            normal. Pertinent negatives include normal prostate                            (size, shape, and consistency).                           The entire examined colon appeared normal on direct                            and retroflexion views. Complications:  No immediate complications. Estimated Blood Loss:     Estimated blood loss: none. Impression:               - The entire examined colon is normal on direct and                            retroflexion views.                           - No specimens collected. Recommendation:           - Patient has a contact number available for                            emergencies. The signs and symptoms of potential                            delayed complications were discussed with the                            patient. Return to normal activities tomorrow.                            Written discharge instructions were provided to the                            patient.                           - Resume previous diet.                           - Continue present medications.                           - Repeat colonoscopy in 10 years for screening                             purposes. Iva Boop, MD 12/09/2021 2:38:03 PM This report has been signed electronically.

## 2021-12-09 NOTE — Patient Instructions (Addendum)
The colonoscopy was normal - no polyps or cancer were seen.  Next routine colonoscopy or other screening test in 10 years - 2033.  I appreciate the opportunity to care for you. Iva Boop, MD, [facility]    You may resume your current medications today. Repeat screening colonoscopy in 10 years. Please call if any questions or concerns.     YOU HAD AN ENDOSCOPIC PROCEDURE TODAY AT THE [facility]:   Refer to the procedure report that was given to you for any specific questions about what was found during the examination.  If the procedure report does not answer your questions, please call your gastroenterologist to clarify.  If you requested that your care partner not be given the details of your procedure findings, then the procedure report has been included in a sealed envelope for you to review at your convenience later.  YOU SHOULD EXPECT: Some feelings of bloating in the abdomen. Passage of more gas than usual.  Walking can help get rid of the air that was put into your GI tract during the procedure and reduce the bloating. If you had a lower endoscopy (such as a colonoscopy or flexible sigmoidoscopy) you may notice spotting of blood in your stool or on the toilet paper. If you underwent a bowel prep for your procedure, you may not have a normal bowel movement for a few days.  Please Note:  You might notice some irritation and congestion in your nose or some drainage.  This is from the oxygen used during your procedure.  There is no need for concern and it should clear up in a day or so.  SYMPTOMS TO REPORT IMMEDIATELY:  Following lower endoscopy (colonoscopy or flexible sigmoidoscopy):  Excessive amounts of blood in the stool  Significant tenderness or worsening of abdominal pains  Swelling of the abdomen that is new, acute  Fever of 100F or higher    For urgent or emergent issues, a gastroenterologist can be reached at any hour by calling [phone]. Do not use  MyChart messaging for urgent concerns.    DIET:  We do recommend a small meal at first, but then you may proceed to your regular diet.  Drink plenty of fluids but you should avoid alcoholic beverages for 24 hours.  ACTIVITY:  You should plan to take it easy for the rest of today and you should NOT DRIVE or use heavy machinery until tomorrow (because of the sedation medicines used during the test).    FOLLOW UP: Our staff will call the number listed on your records 24-72 hours following your procedure to check on you and address any questions or concerns that you may have regarding the information given to you following your procedure. If we do not reach you, we will leave a message.  We will attempt to reach you two times.  During this call, we will ask if you have developed any symptoms of COVID 19. If you develop any symptoms (ie: fever, flu-like symptoms, shortness of breath, cough etc.) before then, please call [phone].  If you test positive for Covid 19 in the 2 weeks post procedure, please call and report this information to [place].    If any biopsies were taken you will be contacted by phone or by letter within the next 1-3 weeks.  Please call us at [phone] if you have not heard about the biopsies in 3 weeks.    SIGNATURES/CONFIDENTIALITY: You and/or your care partner have signed paperwork which  will be entered into your electronic medical record.  These signatures attest to the fact that that the information above on your After Visit Summary has been reviewed and is understood.  Full responsibility of the confidentiality of this discharge information lies with you and/or your care-partner.

## 2021-12-10 ENCOUNTER — Telehealth: Payer: Self-pay

## 2021-12-10 NOTE — Telephone Encounter (Signed)
  Follow up Call-     12/09/2021    1:51 PM  Call back number  Post procedure Call Back phone  # [phone]  Permission to leave phone message Yes     Patient questions:  Do you have a fever, pain , or abdominal swelling? No. Pain Score  0 *  Have you tolerated food without any problems? Yes.    Have you been able to return to your normal activities? Yes.    Do you have any questions about your discharge instructions: Diet   No. Medications  No. Follow up visit  No.  Do you have questions or concerns about your Care? No.  Actions: * If pain score is 4 or above: No action needed, pain <4.

## 2022-07-27 ENCOUNTER — Encounter: Payer: Self-pay | Admitting: Family Medicine

## 2022-07-27 ENCOUNTER — Ambulatory Visit (INDEPENDENT_AMBULATORY_CARE_PROVIDER_SITE_OTHER): Payer: BC Managed Care – PPO | Admitting: Family Medicine

## 2022-07-27 VITALS — BP 110/80 | HR 74 | Temp 98.1°F | Ht 73.0 in | Wt 217.0 lb

## 2022-07-27 DIAGNOSIS — Z23 Encounter for immunization: Secondary | ICD-10-CM

## 2022-07-27 DIAGNOSIS — Z125 Encounter for screening for malignant neoplasm of prostate: Secondary | ICD-10-CM | POA: Diagnosis not present

## 2022-07-27 DIAGNOSIS — R0683 Snoring: Secondary | ICD-10-CM | POA: Diagnosis not present

## 2022-07-27 DIAGNOSIS — Z0000 Encounter for general adult medical examination without abnormal findings: Secondary | ICD-10-CM

## 2022-07-27 LAB — COMPREHENSIVE METABOLIC PANEL
ALT: 37 U/L (ref 0–53)
AST: 24 U/L (ref 0–37)
Albumin: 4.3 g/dL (ref 3.5–5.2)
Alkaline Phosphatase: 40 U/L (ref 39–117)
BUN: 17 mg/dL (ref 6–23)
CO2: 29 mEq/L (ref 19–32)
Calcium: 8.8 mg/dL (ref 8.4–10.5)
Chloride: 102 mEq/L (ref 96–112)
Creatinine, Ser: 0.96 mg/dL (ref 0.40–1.50)
GFR: 92.43 mL/min (ref >60.00)
Glucose, Bld: 88 mg/dL (ref 70–99)
Potassium: 4.5 mEq/L (ref 3.5–5.1)
Sodium: 139 mEq/L (ref 135–145)
Total Bilirubin: 0.5 mg/dL (ref 0.2–1.2)
Total Protein: 6.8 g/dL (ref 6.0–8.3)

## 2022-07-27 LAB — PSA: PSA: 0.35 ng/mL (ref 0.10–4.00)

## 2022-07-27 LAB — CBC
HCT: 41.4 % (ref 39.0–52.0)
Hemoglobin: 14.1 g/dL (ref 13.0–17.0)
MCHC: 34.2 g/dL (ref 30.0–36.0)
MCV: 90.8 fl (ref 78.0–100.0)
Platelets: 215 10*3/uL (ref 150.0–400.0)
RBC: 4.56 Mil/uL (ref 4.22–5.81)
RDW: 12.9 % (ref 11.5–15.5)
WBC: 5.3 10*3/uL (ref 4.0–10.5)

## 2022-07-27 LAB — LIPID PANEL
Cholesterol: 147 mg/dL (ref 0–200)
HDL: 49.8 mg/dL (ref >39.00)
LDL Cholesterol: 78 mg/dL (ref 0–99)
NonHDL: 96.85
Total CHOL/HDL Ratio: 3
Triglycerides: 95 mg/dL (ref 0.0–149.0)
VLDL: 19 mg/dL (ref 0.0–40.0)

## 2022-07-27 NOTE — Patient Instructions (Addendum)
Give [place] 2-3 business days to get the results of your labs back.   Keep the diet clean and stay active.  Please get me a copy of your advanced directive form at your convenience.   If you do not hear anything about your referral in the next 1-2 weeks, call our office and ask for an update.  Foods that may reduce pain: 1) Ginger 2) Blueberries 3) Salmon 4) Pumpkin seeds 5) dark chocolate 6) turmeric 7) tart cherries 8) virgin olive oil 9) chilli peppers 10) mint 11) krill oil  Let us know if you need anything.  Knee Exercises It is normal to feel mild stretching, pulling, tightness, or discomfort as you do these exercises, but you should stop right away if you feel sudden pain or your pain gets worse.  STRETCHING AND RANGE OF MOTION EXERCISES  These exercises warm up your muscles and joints and improve the movement and flexibility of your knee. These exercises also help to relieve pain, numbness, and tingling. Exercise A: Knee Extension, Prone  Lie on your abdomen on a bed. Place your left / right knee just beyond the edge of the surface so your knee is not on the bed. You can put a towel under your left / right thigh just above your knee for comfort. Relax your leg muscles and allow gravity to straighten your knee. You should feel a stretch behind your left / right knee. Hold this position for 30 seconds. Scoot up so your knee is supported between repetitions. Repeat 2 times. Complete this stretch 3 times per week. Exercise B: Knee Flexion, Active     Lie on your back with both knees straight. If this causes back discomfort, bend your left / right knee so your foot is flat on the floor. Slowly slide your left / right heel back toward your buttocks until you feel a gentle stretch in the front of your knee or thigh. Hold this position for 30 seconds. Slowly slide your left / right heel back to the starting position. Repeat 2 times. Complete this exercise 3 times per  week. Exercise C: Quadriceps, Prone     Lie on your abdomen on a firm surface, such as a bed or padded floor. Bend your left / right knee and hold your ankle. If you cannot reach your ankle or pant leg, loop a belt around your foot and grab the belt instead. Gently pull your heel toward your buttocks. Your knee should not slide out to the side. You should feel a stretch in the front of your thigh and knee. Hold this position for 30 seconds. Repeat 2 times. Complete this stretch 3 times per week. Exercise D: Hamstring, Supine  Lie on your back. Loop a belt or towel over the ball of your left / right foot. The ball of your foot is on the walking surface, right under your toes. Straighten your left / right knee and slowly pull on the belt to raise your leg until you feel a gentle stretch behind your knee. Do not let your left / right knee bend while you do this. Keep your other leg flat on the floor. Hold this position for 30 seconds. Repeat 2 times. Complete this stretch 3 times per week. STRENGTHENING EXERCISES  These exercises build strength and endurance in your knee. Endurance is the ability to use your muscles for a long time, even after they get tired. Exercise E: Quadriceps, Isometric     Lie on your back with  your left / right leg extended and your other knee bent. Put a rolled towel or small pillow under your knee if told by your health care provider. Slowly tense the muscles in the front of your left / right thigh. You should see your kneecap slide up toward your hip or see increased dimpling just above the knee. This motion will push the back of the knee toward the floor. For 3 seconds, keep the muscle as tight as you can without increasing your pain. Relax the muscles slowly and completely. Repeat for 10 total reps Repeat 2 ti mes. Complete this exercise 3 times per week. Exercise F: Straight Leg Raises - Quadriceps  Lie on your back with your left / right leg extended and your  other knee bent. Tense the muscles in the front of your left / right thigh. You should see your kneecap slide up or see increased dimpling just above the knee. Your thigh may even shake a bit. Keep these muscles tight as you raise your leg 4-6 inches (10-15 cm) off the floor. Do not let your knee bend. Hold this position for 3 seconds. Keep these muscles tense as you lower your leg. Relax your muscles slowly and completely after each repetition. 10 total reps. Repeat 2 times. Complete this exercise 3 times per week.  Exercise G: Hamstring Curls     If told by your health care provider, do this exercise while wearing ankle weights. Begin with 5 lb weights (optional). Then increase the weight by 1 lb (0.5 kg) increments. Do not wear ankle weights that are more than 20 lbs to start with. Lie on your abdomen with your legs straight. Bend your left / right knee as far as you can without feeling pain. Keep your hips flat against the floor. Hold this position for 3 seconds. Slowly lower your leg to the starting position. Repeat for 10 reps.  Repeat 2 times. Complete this exercise 3 times per week. Exercise H: Squats (Quadriceps)  Stand in front of a table, with your feet and knees pointing straight ahead. You may rest your hands on the table for balance but not for support. Slowly bend your knees and lower your hips like you are going to sit in a chair. Keep your weight over your heels, not over your toes. Keep your lower legs upright so they are parallel with the table legs. Do not let your hips go lower than your knees. Do not bend lower than told by your health care provider. If your knee pain increases, do not bend as low. Hold the squat position for 1 second. Slowly push with your legs to return to standing. Do not use your hands to pull yourself to standing. Repeat 2 times. Complete this exercise 3 times per week. Exercise I: Wall Slides (Quadriceps)     Lean your back against a smooth  wall or door while you walk your feet out 18-24 inches (46-61 cm) from it. Place your feet hip-width apart. Slowly slide down the wall or door until your knees Repeat 2 times. Complete this exercise every other day. Exercise K: Straight Leg Raises - Hip Abductors  Lie on your side with your left / right leg in the top position. Lie so your head, shoulder, knee, and hip line up. You may bend your bottom knee to help you keep your balance. Roll your hips slightly forward so your hips are stacked directly over each other and your left / right knee is facing forward.  Leading with your heel, lift your top leg 4-6 inches (10-15 cm). You should feel the muscles in your outer hip lifting. Do not let your foot drift forward. Do not let your knee roll toward the ceiling. Hold this position for 3 seconds. Slowly return your leg to the starting position. Let your muscles relax completely after each repetition. 10 total reps. Repeat 2 times. Complete this exercise 3 times per week. Exercise J: Straight Leg Raises - Hip Extensors  Lie on your abdomen on a firm surface. You can put a pillow under your hips if that is more comfortable. Tense the muscles in your buttocks and lift your left / right leg about 4-6 inches (10-15 cm). Keep your knee straight as you lift your leg. Hold this position for 3 seconds. Slowly lower your leg to the starting position. Let your leg relax completely after each repetition. Repeat 2 times. Complete this exercise 3 times per week. Document Released: 05/06/2005 Document Revised: 03/16/2016 Document Reviewed: 04/28/2015 Elsevier Interactive Patient Education  2017 [employer].

## 2022-07-27 NOTE — Addendum Note (Signed)
Addended by: Sharon Seller B on: 07/27/2022 01:36 PM   Modules accepted: Orders

## 2022-07-27 NOTE — Progress Notes (Signed)
Chief Complaint  Patient presents with   Annual Exam    Has not been consistent with cholesterol medicine due to the pharmacy    Well Male Scout Gumbs NFAOZHY is here for a complete physical.   His last physical was >1 year ago.  Current diet: in general, diet is fair.  Current exercise: active in yard, walking Weight trend: increased a little Fatigue out of ordinary? No. Seat belt? Yes.   Advanced directive? No  Health maintenance Shingrix- No Colonoscopy- Yes Tetanus- Yes HIV- Yes Hep C- Yes   Past Medical History:  Diagnosis Date   GERD (gastroesophageal reflux disease)    on omeprazole   Headache    Hyperlipidemia    on statin   Migraine      History reviewed. No pertinent surgical history.  Medications  Current Outpatient Medications on File Prior to Visit  Medication Sig Dispense Refill   ibuprofen (ADVIL) 200 MG tablet Take 200 mg by mouth every 6 (six) hours as needed.     omeprazole (PRILOSEC) 20 MG capsule Take 20 mg by mouth daily.     rosuvastatin (CRESTOR) 20 MG tablet Take 1 tablet (20 mg total) by mouth daily. 90 tablet 3   Allergies Allergies  Allergen Reactions   Codeine Nausea And Vomiting    Family History Family History  Problem Relation Age of Onset   Diabetes Father    Hypertension Father    Alcohol abuse Maternal Grandfather    Alzheimer's disease Paternal Grandmother    Heart disease Paternal Grandfather    Hyperlipidemia Paternal Grandfather    Colon polyps Neg Hx    Colon cancer Neg Hx    Esophageal cancer Neg Hx    Rectal cancer Neg Hx    Stomach cancer Neg Hx     Review of Systems: Constitutional:  no fevers Eye:  no recent significant change in vision Ear/Nose/Mouth/Throat:  Ears: +intermittent r ear pain Nose/Mouth/Throat:  no complaints of nasal congestion, no sore throat Cardiovascular:  no chest pain Respiratory:  no shortness of breath Gastrointestinal:  no change in bowel habits GU:  Male: negative for dysuria,  frequency Musculoskeletal/Extremities:  +b/l knee pain Integumentary (Skin/Breast):  no abnormal skin lesions reported Neurologic:  no headaches Endocrine: No unexpected weight changes Hematologic/Lymphatic:  no abnormal bleeding  Exam BP 110/80 (BP Location: Left Arm, Patient Position: Sitting, Cuff Size: Normal)   Pulse 74   Temp 98.1 F (36.7 C) (Oral)   Ht 6\' 1"  (1.854 m)   Wt 217 lb (98.4 kg)   SpO2 98%   BMI 28.63 kg/m  General:  well developed, well nourished, in no apparent distress Skin:  no significant moles, warts, or growths Head:  no masses, lesions, or tenderness Eyes:  pupils equal and round, sclera anicteric without injection Ears:  canals without lesions, TMs shiny without retraction, no obvious effusion, no erythema Nose:  nares patent, mucosa normal Throat/Pharynx:  lips and gingiva without lesion; tongue and uvula midline; non-inflamed pharynx; no exudates or postnasal drainage Neck: neck supple without adenopathy, thyromegaly, or masses Cardiac: RRR, no bruits, no LE edema Lungs:  clear to auscultation, breath sounds equal bilaterally, no respiratory distress Abdomen: BS+, soft, non-tender, non-distended, no masses or organomegaly noted Rectal: Deferred Musculoskeletal:  symmetrical muscle groups noted without atrophy or deformity Neuro:  gait normal; deep tendon reflexes normal and symmetric Psych: well oriented with normal range of affect and appropriate judgment/insight  Assessment and Plan  Well adult exam - Plan: CBC, Comprehensive  metabolic panel, Lipid panel  Snoring - Plan: Ambulatory referral to Pulmonology   Well 52 y.o. male. Counseled on diet and exercise. Counseled on risks and benefits of prostate cancer screening with PSA. The patient agrees to undergo testing. Tdap and Shingrix today. Advanced directive form provided today.  Immunizations, labs, and further orders as above. Follow up in 6 mo or prn. The patient voiced understanding  and agreement to the plan.  [place], DO 07/27/22 12:59 PM

## 2022-08-04 ENCOUNTER — Other Ambulatory Visit: Payer: Self-pay | Admitting: Family Medicine

## 2022-08-12 ENCOUNTER — Encounter: Payer: Self-pay | Admitting: Nurse Practitioner

## 2022-08-12 ENCOUNTER — Telehealth (INDEPENDENT_AMBULATORY_CARE_PROVIDER_SITE_OTHER): Payer: BC Managed Care – PPO | Admitting: Nurse Practitioner

## 2022-08-12 VITALS — Ht 73.0 in | Wt 217.0 lb

## 2022-08-12 DIAGNOSIS — R0683 Snoring: Secondary | ICD-10-CM | POA: Diagnosis not present

## 2022-08-12 DIAGNOSIS — J310 Chronic rhinitis: Secondary | ICD-10-CM

## 2022-08-12 DIAGNOSIS — G4719 Other hypersomnia: Secondary | ICD-10-CM

## 2022-08-12 DIAGNOSIS — K219 Gastro-esophageal reflux disease without esophagitis: Secondary | ICD-10-CM

## 2022-08-12 NOTE — Assessment & Plan Note (Signed)
Controlled with omeprazole. GERD prevention measures reviewed.

## 2022-08-12 NOTE — Progress Notes (Signed)
Patient ID: Cesar Collier, male     DOB: 12-Dec-1970, 52 y.o.      MRN: [id number]  Chief Complaint  Patient presents with   Consult    Pt sleep consult for snoring and witnessed apneas by his wife. Pt reports he has never had a sleep study    Virtual Visit via Video Note  I connected with Cesar Collier on 08/12/22 at  2:00 PM EST by a video enabled telemedicine application and verified that I am speaking with the correct person using two identifiers.  Location: Patient: Home Provider: Office   I discussed the limitations of evaluation and management by telemedicine and the availability of in person appointments. The patient expressed understanding and agreed to proceed.  History of Present Illness: 52 year old male, never smoker referred for sleep consult. Past medical history significant for HLD and GERD.   TESTS/EVENTS:  08/12/2022: Today - sleep consult Patient presents today for sleep consult. He has been having trouble with loud snoring for many years; started probably 5-7 years ago. He has also been told by his wife that he stops breathing when he sleeps at night. His snoring is so loud that they have to sleep in separate rooms. He also has been sleeping on a futon due to back pain from a herniated disc; however, they would like to get a dual adjustable bed, if they can fix his snoring. He also has trouble with daytime fatigue. He doesn't necessarily feel like he could fall asleep or is drowsy but he just doesn't feel like his energy levels are great. He wakes feeling poorly rested. He also has been having trouble with morning headaches, as recently as this morning. He has had trouble with nasal congestion for many years now. He thought this may be associated with his snoring. He also was having trouble with his ears so he had ENT workup a few years ago without any structural abnormalities identified. He does feel like his symptoms have gotten worse since but hasn't been back  for re-evaluation or second opinion. He doesn't take any antihistamines. He denies any drowsy driving, sleep parasomnias/paralysis. No history of narcolepsy or cataplexy.  He goes to bed around 10 pm. Falls asleep quickly. No sleep aids. Wakes once a night to use the restroom. Gets up around 6 am. He does drink coffee during the day to help keep him alert. He has never had a previous sleep study. No significant weight gain in the past 2 years.  He has a history of reflux and HLD. No history of HTN, DM or stroke. He does take omeprazole for the reflux, which helps. If not, it is very severe, especially at night. He is a never smoker. Rarely drinks alcohol. Lives at home with wife and kids. 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   Epworth 0  Allergies  Allergen Reactions   Codeine Nausea And Vomiting   Immunization History  Administered Date(s) Administered   Influenza, Seasonal, Injecte, Preservative Fre 05/16/2017   Influenza-Unspecified 04/17/2021, 07/13/2022   PFIZER Comirnaty(Gray Top)Covid-19 Tri-Sucrose Vaccine 03/05/2020, 03/26/2020   Tdap 05/04/2006, 07/27/2022   Zoster Recombinat (Shingrix) 07/27/2022   Past Medical History:  Diagnosis Date   GERD (gastroesophageal reflux disease)    on omeprazole   Headache    Hyperlipidemia    on statin   Migraine     Tobacco History: Social History   Tobacco Use  Smoking Status Never  Smokeless Tobacco Never   Counseling given: Not Answered   Outpatient Medications Prior to Visit  Medication Sig Dispense Refill   ibuprofen (ADVIL) 200 MG tablet Take 200 mg by mouth every 6 (six) hours as needed.     omeprazole (PRILOSEC) 20 MG capsule Take 20 mg by mouth daily.     rosuvastatin (CRESTOR) 20 MG tablet TAKE 1 TABLET BY MOUTH EVERY DAY 30 tablet 11   No facility-administered medications prior to visit.     Review of Systems:   Constitutional: No weight loss or gain,  night sweats, fevers, chills, or lassitude. +daytime fatigue HEENT: No headaches, difficulty swallowing, tooth/dental problems, or sore throat. No sneezing, itching. + ear ache, nasal congestion (chronic) CV:  No chest pain, orthopnea, PND, swelling in lower extremities, anasarca, dizziness, palpitations, syncope Resp: +Snoring, witnessed nocturnal apneas. No shortness of breath with exertion or at rest. No excess mucus or change in color of mucus. No productive or non-productive. No hemoptysis. No wheezing.  No chest wall deformity GI:  +heartburn, indigestion. No abdominal pain, nausea, vomiting, diarrhea, change in bowel habits, loss of appetite, bloody stools.  GU: No dysuria, change in color of urine, urgency or frequency.  Skin: No rash, lesions, ulcerations MSK:  No joint pain or swelling.  No decreased range of motion.  +chronic back pain. Neuro: No dizziness or lightheadedness.  Psych: No depression or anxiety. Mood stable.   Observations/Objective: Patient is well-developed, well-nourished in no acute distress. A&Ox3. Resting comfortably at home. Unlabored breathing. Speech is clear and coherent with logical content.   Assessment and Plan: Snoring He has snoring, daytime fatigue, nocturnal apneic events, morning headaches, restless sleep. Given this,  I am concerned he could have sleep disordered breathing with obstructive sleep apnea. He will need sleep study for further evaluation.    - discussed how weight can impact sleep and risk for sleep disordered breathing - discussed options to assist with weight loss: combination of diet modification, cardiovascular and strength training exercises   - had an extensive discussion regarding the adverse health consequences related to untreated sleep disordered breathing - specifically discussed the risks for hypertension, coronary artery disease, cardiac dysrhythmias, cerebrovascular disease, and diabetes - lifestyle modification discussed    - discussed how sleep disruption can increase risk of accidents, particularly when driving - safe driving practices were discussed  Patient Instructions  Given your symptoms, I am concerned that you may have sleep disordered breathing with sleep apnea. You will need a sleep study for further evaluation. Someone will contact you to schedule this.  We discussed how untreated sleep apnea puts an individual at risk for cardiac arrhthymias, pulm HTN, DM, stroke and increases their risk for daytime accidents. We also briefly reviewed treatment options including weight loss, side sleeping position, oral appliance, CPAP therapy or referral to ENT for possible surgical options  Use caution when driving and pull over if you become sleepy  Try over the counter allergy pill such as Claritin, Zyrtec, Xyzal for nasal congestion You can also try flonase nasal spray 2 sprays each nostril daily  Follow up after your sleep study with Katie Aurorah Schlachter,NP to review results, or sooner, if needed   Chronic rhinitis Chronic rhinitis with ear pressure/ache. Previous eval by ENT unremarkable. He is not currently on antihistamine. Encouraged him to trial OTC antihistamine such as claritin, zyrtec or Xyzal daily and intranasal steroid. Could consider referral back to ENT if symptoms persist.   Gastroesophageal reflux disease Controlled  with omeprazole. GERD prevention measures reviewed.    I discussed the assessment and treatment plan with the patient. The patient was provided an opportunity to ask questions and all were answered. The patient agreed with the plan and demonstrated an understanding of the instructions.   The patient was advised to call back or seek an in-person evaluation if the symptoms worsen or if the condition fails to improve as anticipated.  I provided 45 minutes of non-face-to-face time during this encounter.   Clayton Bibles, NP

## 2022-08-12 NOTE — Assessment & Plan Note (Addendum)
He has snoring, daytime fatigue, nocturnal apneic events, morning headaches, restless sleep. Given this,  I am concerned he could have sleep disordered breathing with obstructive sleep apnea. He will need sleep study for further evaluation.    - discussed how weight can impact sleep and risk for sleep disordered breathing - discussed options to assist with weight loss: combination of diet modification, cardiovascular and strength training exercises   - had an extensive discussion regarding the adverse health consequences related to untreated sleep disordered breathing - specifically discussed the risks for hypertension, coronary artery disease, cardiac dysrhythmias, cerebrovascular disease, and diabetes - lifestyle modification discussed   - discussed how sleep disruption can increase risk of accidents, particularly when driving - safe driving practices were discussed  Patient Instructions  Given your symptoms, I am concerned that you may have sleep disordered breathing with sleep apnea. You will need a sleep study for further evaluation. Someone will contact you to schedule this.  We discussed how untreated sleep apnea puts an individual at risk for cardiac arrhthymias, pulm HTN, DM, stroke and increases their risk for daytime accidents. We also briefly reviewed treatment options including weight loss, side sleeping position, oral appliance, CPAP therapy or referral to ENT for possible surgical options  Use caution when driving and pull over if you become sleepy  Try over the counter allergy pill such as Claritin, Zyrtec, Xyzal for nasal congestion You can also try flonase nasal spray 2 sprays each nostril daily  Follow up after your sleep study with Katie Maie Kesinger,NP to review results, or sooner, if needed

## 2022-08-12 NOTE — Patient Instructions (Addendum)
Given your symptoms, I am concerned that you may have sleep disordered breathing with sleep apnea. You will need a sleep study for further evaluation. Someone will contact you to schedule this.  We discussed how untreated sleep apnea puts an individual at risk for cardiac arrhthymias, pulm HTN, DM, stroke and increases their risk for daytime accidents. We also briefly reviewed treatment options including weight loss, side sleeping position, oral appliance, CPAP therapy or referral to ENT for possible surgical options  Use caution when driving and pull over if you become sleepy  Try over the counter allergy pill such as Claritin, Zyrtec, Xyzal for nasal congestion You can also try flonase nasal spray 2 sprays each nostril daily  Follow up after your sleep study with Cesar Layza [place],NP to review results, or sooner, if needed

## 2022-08-12 NOTE — Assessment & Plan Note (Signed)
Chronic rhinitis with ear pressure/ache. Previous eval by ENT unremarkable. He is not currently on antihistamine. Encouraged him to trial OTC antihistamine such as claritin, zyrtec or Xyzal daily and intranasal steroid. Could consider referral back to ENT if symptoms persist.

## 2022-08-27 ENCOUNTER — Ambulatory Visit: Payer: BC Managed Care – PPO

## 2022-08-27 DIAGNOSIS — G4733 Obstructive sleep apnea (adult) (pediatric): Secondary | ICD-10-CM

## 2022-08-27 DIAGNOSIS — G4719 Other hypersomnia: Secondary | ICD-10-CM

## 2022-08-27 DIAGNOSIS — R0683 Snoring: Secondary | ICD-10-CM

## 2022-09-11 ENCOUNTER — Ambulatory Visit (INDEPENDENT_AMBULATORY_CARE_PROVIDER_SITE_OTHER): Payer: BC Managed Care – PPO | Admitting: Nurse Practitioner

## 2022-09-11 ENCOUNTER — Telehealth: Payer: Self-pay | Admitting: Nurse Practitioner

## 2022-09-11 DIAGNOSIS — R0683 Snoring: Secondary | ICD-10-CM

## 2022-09-11 DIAGNOSIS — G4733 Obstructive sleep apnea (adult) (pediatric): Secondary | ICD-10-CM | POA: Diagnosis not present

## 2022-09-11 DIAGNOSIS — G4734 Idiopathic sleep related nonobstructive alveolar hypoventilation: Secondary | ICD-10-CM

## 2022-09-14 NOTE — Telephone Encounter (Signed)
Pt scheduled for 3/15 @ 8:30am. Closing encounter

## 2022-09-18 ENCOUNTER — Ambulatory Visit: Payer: BC Managed Care – PPO | Admitting: Nurse Practitioner

## 2022-09-21 NOTE — Progress Notes (Unsigned)
Raeshaun Cross [id number] is a 52 y.o. male presents to the office today for # 2 Shingrix injection, per physician's orders. Original order: 07/27/2022 Shingrix recombinant 0.5 mL (med), 0.5 mL (dose),  IM  was administered R Deltoid today. Patient tolerated injection. Patient due for follow up labs/provider appt: No.  Patient next injection due: n/a  Kaylyn C, CMA (AAMA)

## 2022-09-22 ENCOUNTER — Ambulatory Visit (INDEPENDENT_AMBULATORY_CARE_PROVIDER_SITE_OTHER): Payer: BC Managed Care – PPO

## 2022-09-22 DIAGNOSIS — Z23 Encounter for immunization: Secondary | ICD-10-CM | POA: Diagnosis not present

## 2022-10-30 ENCOUNTER — Other Ambulatory Visit: Payer: Self-pay | Admitting: Family Medicine

## 2022-10-30 MED ORDER — ROSUVASTATIN CALCIUM 20 MG PO TABS: 20.0000 mg | ORAL_TABLET | Freq: Every day | ORAL | 3 refills | Status: DC

## 2022-11-19 DIAGNOSIS — M5416 Radiculopathy, lumbar region: Secondary | ICD-10-CM | POA: Diagnosis not present

## 2022-11-19 DIAGNOSIS — Z6828 Body mass index (BMI) 28.0-28.9, adult: Secondary | ICD-10-CM | POA: Diagnosis not present

## 2022-12-03 ENCOUNTER — Ambulatory Visit: Payer: BC Managed Care – PPO

## 2022-12-07 ENCOUNTER — Ambulatory Visit (HOSPITAL_BASED_OUTPATIENT_CLINIC_OR_DEPARTMENT_OTHER): Payer: BC Managed Care – PPO | Attending: Nurse Practitioner | Admitting: Pulmonary Disease

## 2022-12-07 DIAGNOSIS — G4734 Idiopathic sleep related nonobstructive alveolar hypoventilation: Secondary | ICD-10-CM

## 2022-12-07 DIAGNOSIS — R0683 Snoring: Secondary | ICD-10-CM

## 2022-12-08 ENCOUNTER — Ambulatory Visit: Payer: BC Managed Care – PPO

## 2022-12-08 DIAGNOSIS — R0683 Snoring: Secondary | ICD-10-CM | POA: Diagnosis not present

## 2022-12-08 DIAGNOSIS — M5416 Radiculopathy, lumbar region: Secondary | ICD-10-CM | POA: Diagnosis not present

## 2022-12-08 DIAGNOSIS — M47816 Spondylosis without myelopathy or radiculopathy, lumbar region: Secondary | ICD-10-CM | POA: Diagnosis not present

## 2022-12-08 NOTE — Procedures (Signed)
      Patient Name: Cesar Collier, [name] Date: 12/07/2022 Gender: Male D.O.B: Nov 26, 1970 Age (years): 51 Referring Provider: Noemi Chapel NP Height (inches): 72 Interpreting Physician: Coralyn Helling MD, ABSM Weight (lbs): 217 RPSGT: Rosette Reveal BMI: 29 MRN: [id number] Neck Size: 16.00  CLINICAL INFORMATION Sleep Study Type: NPSG  Indication for sleep study: snoring, sleep disruption.  Epworth Sleepiness Score: 3  SLEEP STUDY TECHNIQUE As per the AASM Manual for the Scoring of Sleep and Associated Events v2.3 (April 2016) with a hypopnea requiring 4% desaturations.  The channels recorded and monitored were frontal, central and occipital EEG, electrooculogram (EOG), submentalis EMG (chin), nasal and oral airflow, thoracic and abdominal wall motion, anterior tibialis EMG, snore microphone, electrocardiogram, and pulse oximetry.  MEDICATIONS Medications self-administered by patient taken the night of the study : N/A  SLEEP ARCHITECTURE The study was initiated at 9:59:15 PM and ended at 4:25:51 AM.  Sleep onset time was 24.3 minutes and the sleep efficiency was 81.8%. The total sleep time was 316.3 minutes.  Stage REM latency was 75.0 minutes.  The patient spent 6.6% of the night in stage N1 sleep, 68.4% in stage N2 sleep, 0.0% in stage N3 and 25% in REM.  Alpha intrusion was absent.  Supine sleep was 11.22%.  RESPIRATORY PARAMETERS The overall apnea/hypopnea index (AHI) was 2.7 per hour. There were 1 total apneas, including 0 obstructive, 1 central and 0 mixed apneas. There were 13 hypopneas and 49 RERAs.  The AHI during Stage REM sleep was 4.6 per hour.  AHI while supine was 16.9 per hour.  The mean oxygen saturation was 95.1%. The minimum SpO2 during sleep was 84.0%.  moderate snoring was noted during this study.  CARDIAC DATA The 2 lead EKG demonstrated sinus rhythm. The mean heart rate was 67.7 beats per minute. Other EKG findings include: None.  LEG  MOVEMENT DATA The total PLMS were 0 with a resulting PLMS index of 0.0. Associated arousal with leg movement index was 0.0 .  IMPRESSIONS - No significant obstructive sleep apnea occurred during this study (AHI = 2.7/h). - No significant central sleep apnea occurred during this study (CAI = 0.2/h). - Mild oxygen desaturation was noted during this study (Min O2 = 84.0%). - The patient snored with moderate snoring volume. - No cardiac abnormalities were noted during this study. - Clinically significant periodic limb movements did not occur during sleep. No significant associated arousals.  DIAGNOSIS - Snoring.  RECOMMENDATIONS - Positional therapy avoiding supine position during sleep. - Avoid alcohol, sedatives and other CNS depressants that may worsen sleep apnea and disrupt normal sleep architecture. - Sleep hygiene should be reviewed to assess factors that may improve sleep quality. - Weight management and regular exercise should be initiated or continued if appropriate.  [Electronically signed] 12/08/2022 01:51 PM  Coralyn Helling MD, ABSM Diplomate, American Board of Sleep Medicine NPI: [id number]  [name] PH: [phone]   FX: [id number] ACCREDITED BY THE AMERICAN ACADEMY OF SLEEP MEDICINE

## 2022-12-15 DIAGNOSIS — M5416 Radiculopathy, lumbar region: Secondary | ICD-10-CM | POA: Diagnosis not present

## 2022-12-15 DIAGNOSIS — Z6828 Body mass index (BMI) 28.0-28.9, adult: Secondary | ICD-10-CM | POA: Diagnosis not present

## 2022-12-31 ENCOUNTER — Telehealth: Payer: Self-pay | Admitting: Nurse Practitioner

## 2023-01-04 NOTE — Telephone Encounter (Signed)
Atc pt, no answer. LVMM for pt to give the office a call back

## 2023-01-05 NOTE — Telephone Encounter (Signed)
Patient is returning phone call. Patient phone number is [phone].

## 2023-01-05 NOTE — Telephone Encounter (Signed)
Called pt to go over hst results. Pt verbalized understanding and declined a visit with Dr.O. Nothing further needed at this time.

## 2023-01-08 NOTE — Progress Notes (Signed)
Error

## 2023-01-15 DIAGNOSIS — M5416 Radiculopathy, lumbar region: Secondary | ICD-10-CM | POA: Diagnosis not present

## 2023-01-25 ENCOUNTER — Encounter: Payer: Self-pay | Admitting: Family Medicine

## 2023-01-25 ENCOUNTER — Ambulatory Visit: Payer: BC Managed Care – PPO | Admitting: Family Medicine

## 2023-01-25 VITALS — BP 110/70 | HR 67 | Temp 98.5°F | Ht 73.0 in | Wt 216.4 lb

## 2023-01-25 DIAGNOSIS — E785 Hyperlipidemia, unspecified: Secondary | ICD-10-CM

## 2023-01-25 MED ORDER — FLUTICASONE PROPIONATE 50 MCG/ACT NA SUSP: 2.0000 | Freq: Every day | NASAL | 3 refills | Status: DC

## 2023-01-25 NOTE — Patient Instructions (Signed)
Give us 2-3 business days to get the results of your labs back.   Keep the diet clean and stay active.  Let us know if you need anything. 

## 2023-01-25 NOTE — Progress Notes (Signed)
Chief Complaint  Patient presents with   Follow-up    6 month    Subjective: Hyperlipidemia Patient presents for Hyperlipidemia follow up. Currently taking Crestor 20 mg/d and compliance with treatment thus far has been good. He denies myalgias. He is adhering to a healthy diet. Exercise: swimming The patient is not known to have coexisting coronary artery disease. No CP or SOB.   Past Medical History:  Diagnosis Date   GERD (gastroesophageal reflux disease)    on omeprazole   Headache    Hyperlipidemia    on statin   Migraine     Objective: BP 110/70 (BP Location: Left Arm, Patient Position: Sitting, Cuff Size: Normal)   Pulse 67   Temp 98.5 F (36.9 C) (Oral)   Ht 6\' 1"  (1.854 m)   Wt 216 lb 6 oz (98.1 kg)   SpO2 99%   BMI 28.55 kg/m  General: Awake, appears stated age HEENT: MMM Heart: RRR, no LE edema, no bruits Lungs: CTAB, no rales, wheezes or rhonchi. No accessory muscle use Psych: Age appropriate judgment and insight, normal affect and mood  Assessment and Plan: Hyperlipidemia, unspecified hyperlipidemia type - Plan: Hepatic function panel, Lipid panel  Chronic, stable. Cont Crestor 20 mg/d. Counseled on diet/exercise.  F/u in 6 mo. The patient voiced understanding and agreement to the plan.  Jilda Roche Duncan, DO 01/25/23  1:25 PM

## 2023-01-26 LAB — HEPATIC FUNCTION PANEL
ALT: 41 U/L (ref 0–53)
AST: 24 U/L (ref 0–37)
Albumin: 4.2 g/dL (ref 3.5–5.2)
Alkaline Phosphatase: 42 U/L (ref 39–117)
Bilirubin, Direct: 0.1 mg/dL (ref 0.0–0.3)
Total Bilirubin: 0.7 mg/dL (ref 0.2–1.2)
Total Protein: 6.6 g/dL (ref 6.0–8.3)

## 2023-01-26 LAB — LIPID PANEL
Cholesterol: 139 mg/dL (ref 0–200)
HDL: 47.9 mg/dL (ref >39.00)
LDL Cholesterol: 67 mg/dL (ref 0–99)
NonHDL: 91.54
Total CHOL/HDL Ratio: 3
Triglycerides: 125 mg/dL (ref 0.0–149.0)
VLDL: 25 mg/dL (ref 0.0–40.0)

## 2023-03-18 ENCOUNTER — Other Ambulatory Visit: Payer: Self-pay

## 2023-03-18 ENCOUNTER — Ambulatory Visit: Payer: BC Managed Care – PPO | Attending: Student

## 2023-03-18 DIAGNOSIS — G8929 Other chronic pain: Secondary | ICD-10-CM | POA: Insufficient documentation

## 2023-03-18 DIAGNOSIS — R29898 Other symptoms and signs involving the musculoskeletal system: Secondary | ICD-10-CM | POA: Diagnosis not present

## 2023-03-18 DIAGNOSIS — M5441 Lumbago with sciatica, right side: Secondary | ICD-10-CM | POA: Insufficient documentation

## 2023-03-18 DIAGNOSIS — R262 Difficulty in walking, not elsewhere classified: Secondary | ICD-10-CM | POA: Insufficient documentation

## 2023-03-18 DIAGNOSIS — R6889 Other general symptoms and signs: Secondary | ICD-10-CM | POA: Diagnosis not present

## 2023-03-18 NOTE — Therapy (Signed)
OUTPATIENT PHYSICAL THERAPY THORACOLUMBAR EVALUATION   Patient Name: Samuel Pischel NWGNFAO MRN: [id number] DOB:[birth year], 52 y.o., male Today's Date: 03/18/2023  END OF SESSION:  PT End of Session - 03/18/23 1658     Visit Number 1    Date for PT Re-Evaluation 06/10/23    Progress Note Due on Visit 10    PT Start Time 1255    PT Stop Time 1400    PT Time Calculation (min) 65 min    Activity Tolerance Patient limited by fatigue;Patient limited by pain    Behavior During Therapy [facility] for tasks assessed/performed;Anxious             Past Medical History:  Diagnosis Date   GERD (gastroesophageal reflux disease)    on omeprazole   Headache    Hyperlipidemia    on statin   Migraine    History reviewed. No pertinent surgical history. Patient Active Problem List   Diagnosis Date Noted   Chronic rhinitis 08/12/2022   Gastroesophageal reflux disease 07/08/2018   Snoring 07/08/2018    PCP: Arva Chafe  REFERRING PROVIDER: Verlin Dike  REFERRING DIAG: R lumbar radiculopathy  Rationale for Evaluation and Treatment: Rehabilitation  THERAPY DIAG:  Chronic right-sided low back pain with right-sided sciatica  Weakness of right hip  Impaired stair climbing  Difficulty in walking, not elsewhere classified  ONSET DATE: may 2024  SUBJECTIVE:                                                                                                                                                                                           SUBJECTIVE STATEMENT: This pain is chronic, for about 5 years.  Some thing set it off recently, and the reason that I sought medical help was because I was down for 2 days, couldn't even get out of bed to make it to the bathroom, my wife had to help me roll and sit up, stand up.  Intensity of symptoms varies, today I am having a good day, but yesterday was a bad day, so I took 2 anti inflammatory pills last night, and another 2 this am.  I  generally take 2 anti inflammatory pills like ibuprofen every day so that I can function, can't go without. I completed the steroid dose pack back in may, also had the injections but have not experienced any long term relief.  I have needed to adapt my sleeping habits, sleep on futon( which is really hard) or floor, also used to go work out at the gym every day but have stopped because of pain. I have been getting in the pool every day because  I thought it might take the pressure off my spine but sometimes it hurts.  Stair climbing is very hard, have to go side ways because my R hip gives away and builds up  pressure and hurts.  When I ride in the car I have to have a very thick, hard support behind my lower back to reduce the pressure.  I can't walk far either. I need to be able to keep up and walk and interact with my kids( age 71 and 35)  right now I can't even walk far enough to do much with them.  PERTINENT HISTORY:  Had flare up of chronic lower back pain with R radicular pain . Saw neurologist in May.  Prescribed steroid pack and also had injection.  Had MRI of LS spine, followed up with neurologist, referred to PT for eval and treat.  PAIN:  Are you having pain? Yes: NPRS scale: 7-10 /10 Pain location: R gluteals, base of spine on R Pain description: intermittent intensity, sometimes weakness, numbness entire R leg, R ant thigh always numb Aggravating factors: sitting, walking, stair climbing is worst, all activities Relieving factors: NSAID's  PRECAUTIONS: None  RED FLAGS: None   WEIGHT BEARING RESTRICTIONS: No  FALLS:  Has patient fallen in last 6 months? No  LIVING ENVIRONMENT: Lives with: lives with their family Lives in: House/apartment Stairs: indoor to second floor, rail Has following equipment at home: None  OCCUPATION: works in office, desk job, on computer   PLOF: Independent  PATIENT GOALS: to be able to walk, sleep without pain be able to play with my kids  NEXT  MD VISIT: 03/23/23  OBJECTIVE:   DIAGNOSTIC FINDINGS:  MRI with foraminal to extraforaminal disc protrusion at L3-4, L5-S1 disc protrusion on R contacting S1 nerve root  L5/S1   PATIENT SURVEYS:  FOTO lumbar 43% disability  SCREENING FOR RED FLAGS: Bowel or bladder incontinence: No Spinal tumors: No Cauda equina syndrome: No Compression fracture: No Abdominal aneurysm: No  COGNITION: Overall cognitive status: Within functional limits for tasks assessed     SENSATION: Generally decreased sensation L ant thigh  MUSCLE LENGTH: Hamstrings: Right -60 deg; Left -60 deg, in supine with hips at 90 degrees Prone knee flexion test: Right wnl deg; Left wnl deg  POSTURE: in standing R lateral pelvic shift, loss of muscle mass noted B gluts, loss of lumbar lordosis noted  PALPATION: Patient with edematous region noted L 1 to S1 region, difficulty observing spinous processes, non warm  or red.  PA segmental glides Lumbar spine with pain noted centrally L2  Tender and thickened R gluteals, particularly glut medius, minimus  LUMBAR ROM:   AROM eval  Flexion 60  Extension 50  Right lateral flexion 60  Left lateral flexion 75  Right rotation nt  Left rotation nt   (Blank rows = not tested)  LOWER EXTREMITY ROM:    B hip flexion limited to 90 degrees, IR B 15 degrees, ER 35 degrees B Some p! R hip flexion and IR with overpressure LOWER EXTREMITY MMT:    MMT Right eval Left eval  Hip flexion    Hip extension 3+   Hip abduction 4   Hip adduction    Hip internal rotation    Hip external rotation    Knee flexion    Knee extension 5 5  Ankle dorsiflexion heel walk wnl wnl  Ankle plantarflexion toe walk wnl wnl  Ankle inversion    Ankle eversion     (Blank  rows = not tested)  LUMBAR SPECIAL TESTS:  Straight leg raise test: Positive and FABER test: Positive  FUNCTIONAL TESTS:  30 seconds chair stand test   patient completed 7 reps in 17.94 sec, then had to stop due to  increasing pain R post hip/ gluts, lower back  GAIT: Distance walked: in clinic up to 75', no specific deficits noted   TODAY'S TREATMENT:                                                                                                                              DATE: 03/18/23 Instructed patient in McKenzie extension ex and progression due to disc protrusion noted on MRI Also instructed in L side lying stretch with trunk rotation , with pillow under R lumbar region, and R thigh forward, dropping to achieve stretch of lower L lumbar spine.   PATIENT EDUCATION:  Education details: eval results, POC Person educated: Patient Education method: Medical illustrator Education comprehension: returned demonstration  HOME EXERCISE PROGRAM: As instructed above  ASSESSMENT:  CLINICAL IMPRESSION: Patient is a 52 y.o. male who was evaluated today by skilled  physical therapy due to R lumbar radiculopathy.  This pain has been chronic for about 5 years.  Increased in intensity in May of this year, so much that he sought medical treatment.  His lumbar region presents with high irritability and he is unable to achieve any relief with positioning or therex.  He is taking ibuprofen every day, sleeping on the floor ,and has altered his life style significantly due to his pain.  He does have R gluteal weakness, was unable to complete 30 sec sit to stand test today due to pain. Also very restricted B hip flexion ROM and hamstring tightness indicative of fascial and neural restrictions posterior chain.  He has loss of lumbar lordosis and edematous lumbar region, tender R gluteals. He may benefit from a trial of skilled PT but will return to neurosurgeon's office on Tuesday.  Due to his intractable Sx and high irritability of Sx and loss of gluteal muscular strength potential for improvement with skilled PT is guarded.  Will await his follow up appt next week to see if they schedule surgical intervention, or  recommend ongoing trial of PT.  OBJECTIVE IMPAIRMENTS: decreased activity tolerance, decreased endurance, decreased mobility, difficulty walking, decreased ROM, decreased strength, hypomobility, increased edema, increased fascial restrictions, impaired flexibility, impaired sensation, improper body mechanics, postural dysfunction, and pain.   ACTIVITY LIMITATIONS: carrying, lifting, bending, standing, sleeping, stairs, transfers, bed mobility, dressing, locomotion level, and caring for others  PARTICIPATION LIMITATIONS: driving, community activity, occupation, and yard work  PERSONAL FACTORS: Behavior pattern, Past/current experiences, Time since onset of injury/illness/exacerbation, and 1-2 comorbidities: GERD  are also affecting patient's functional outcome.   REHAB POTENTIAL: Poor guarded  CLINICAL DECISION MAKING: Evolving/moderate complexity  EVALUATION COMPLEXITY: Moderate   GOALS: Goals reviewed with patient? Yes  SHORT TERM GOALS: Target date: I HEP 2 weeks  04/01/23  Instructed in basic lumbar stretches and Mckenzie extension progression today. Baseline: Goal status: INITIAL  LONG TERM GOALS: Target date: TBD  will establish long term goals and additional treatment plan pending pts appt with surgeon next week   Baseline:  Goal status: INITIAL  2.   Baseline:  Goal status: INITIAL  3.   Baseline:  Goal status: INITIAL  4.   Baseline:  Goal status: INITIAL   PLAN:  PT FREQUENCY: one time visit  PT DURATION: other: awaiting next appt with neurosurgeon's offic  PLANNED INTERVENTIONS: Therapeutic exercises, Therapeutic activity, Neuromuscular re-education, Balance training, Gait training, Patient/Family education, Self Care, and Joint mobilization.  PLAN FOR NEXT SESSION: will establish goals, POC as needed. Perform DN, jt mobs, therex progression   Kapil Petropoulos L Manisha Cancel, PT, DPT, OCS 03/18/2023, 5:01 PM

## 2023-03-23 DIAGNOSIS — M5416 Radiculopathy, lumbar region: Secondary | ICD-10-CM | POA: Diagnosis not present

## 2023-05-04 DIAGNOSIS — M5416 Radiculopathy, lumbar region: Secondary | ICD-10-CM | POA: Diagnosis not present

## 2023-07-28 ENCOUNTER — Encounter: Payer: BC Managed Care – PPO | Admitting: Family Medicine

## 2023-08-02 ENCOUNTER — Ambulatory Visit (INDEPENDENT_AMBULATORY_CARE_PROVIDER_SITE_OTHER): Payer: BC Managed Care – PPO | Admitting: Family Medicine

## 2023-08-02 ENCOUNTER — Encounter: Payer: Self-pay | Admitting: Family Medicine

## 2023-08-02 ENCOUNTER — Other Ambulatory Visit: Payer: Self-pay

## 2023-08-02 VITALS — BP 126/80 | HR 91 | Temp 98.0°F | Resp 16 | Ht 73.0 in | Wt 220.0 lb

## 2023-08-02 DIAGNOSIS — Z125 Encounter for screening for malignant neoplasm of prostate: Secondary | ICD-10-CM | POA: Diagnosis not present

## 2023-08-02 DIAGNOSIS — Z0000 Encounter for general adult medical examination without abnormal findings: Secondary | ICD-10-CM

## 2023-08-02 LAB — LIPID PANEL
Cholesterol: 141 mg/dL (ref 0–200)
HDL: 43.3 mg/dL (ref >39.00)
LDL Cholesterol: 70 mg/dL (ref 0–99)
NonHDL: 97.77
Total CHOL/HDL Ratio: 3
Triglycerides: 140 mg/dL (ref 0.0–149.0)
VLDL: 28 mg/dL (ref 0.0–40.0)

## 2023-08-02 LAB — CBC
HCT: 43.1 % (ref 39.0–52.0)
Hemoglobin: 14.6 g/dL (ref 13.0–17.0)
MCHC: 33.8 g/dL (ref 30.0–36.0)
MCV: 92.2 fL (ref 78.0–100.0)
Platelets: 228 10*3/uL (ref 150.0–400.0)
RBC: 4.68 Mil/uL (ref 4.22–5.81)
RDW: 13.1 % (ref 11.5–15.5)
WBC: 6.7 10*3/uL (ref 4.0–10.5)

## 2023-08-02 LAB — COMPREHENSIVE METABOLIC PANEL
ALT: 48 U/L (ref 0–53)
AST: 23 U/L (ref 0–37)
Albumin: 4.2 g/dL (ref 3.5–5.2)
Alkaline Phosphatase: 48 U/L (ref 39–117)
BUN: 17 mg/dL (ref 6–23)
CO2: 29 meq/L (ref 19–32)
Calcium: 9.2 mg/dL (ref 8.4–10.5)
Chloride: 106 meq/L (ref 96–112)
Creatinine, Ser: 0.99 mg/dL (ref 0.40–1.50)
GFR: 88.45 mL/min (ref >60.00)
Glucose, Bld: 88 mg/dL (ref 70–99)
Potassium: 4.2 meq/L (ref 3.5–5.1)
Sodium: 141 meq/L (ref 135–145)
Total Bilirubin: 0.4 mg/dL (ref 0.2–1.2)
Total Protein: 6.5 g/dL (ref 6.0–8.3)

## 2023-08-02 LAB — PSA: PSA: 0.93 ng/mL (ref 0.10–4.00)

## 2023-08-02 NOTE — Progress Notes (Signed)
Chief Complaint  Patient presents with   Annual Exam    Annual Exam    Well Male Cesar Collier [id number] is here for a complete physical.   His last physical was >1 year ago.  Current diet: in general, a "healthy" diet.  Current exercise: walking Weight trend: stable Fatigue out of ordinary? No. Seat belt? Yes.   Advanced directive? No  Health maintenance Shingrix- Yes Colonoscopy- Yes Tetanus- Yes HIV- Yes Hep C- Yes   Past Medical History:  Diagnosis Date   GERD (gastroesophageal reflux disease)    on omeprazole   Headache    Hyperlipidemia    on statin   Migraine      History reviewed. No pertinent surgical history.  Medications  Current Outpatient Medications on File Prior to Visit  Medication Sig Dispense Refill   fluticasone (FLONASE) 50 MCG/ACT nasal spray Place 2 sprays into both nostrils daily. 48 g 3   ibuprofen (ADVIL) 200 MG tablet Take 200 mg by mouth every 6 (six) hours as needed.     omeprazole (PRILOSEC) 20 MG capsule Take 20 mg by mouth daily.     rosuvastatin (CRESTOR) 20 MG tablet Take 1 tablet (20 mg total) by mouth daily. 90 tablet 3   Allergies Allergies  Allergen Reactions   Codeine Nausea And Vomiting    Family History Family History  Problem Relation Age of Onset   Diabetes Father    Hypertension Father    Alcohol abuse Maternal Grandfather    Alzheimer's disease Paternal Grandmother    Heart disease Paternal Grandfather    Hyperlipidemia Paternal Grandfather    Colon polyps Neg Hx    Colon cancer Neg Hx    Esophageal cancer Neg Hx    Rectal cancer Neg Hx    Stomach cancer Neg Hx     Review of Systems: Constitutional:  no fevers Eye:  no recent significant change in vision Ear/Nose/Mouth/Throat:  Ears:  no hearing loss Nose/Mouth/Throat:  no complaints of nasal congestion, no sore throat Cardiovascular:  no chest pain Respiratory:  no shortness of breath Gastrointestinal:  no change in bowel habits GU:  Male: negative for  dysuria, frequency Musculoskeletal/Extremities: +L thumb pain; otherwise no joint pain Integumentary (Skin/Breast):  no abnormal skin lesions reported Neurologic:  no headaches Endocrine: No unexpected weight changes Hematologic/Lymphatic:  no abnormal bleeding  Exam BP 126/80   Pulse 91   Temp 98 F (36.7 C) (Oral)   Resp 16   Ht 6\' 1"  (1.854 m)   Wt 220 lb (99.8 kg)   SpO2 97%   BMI 29.03 kg/m  General:  well developed, well nourished, in no apparent distress Skin:  no significant moles, warts, or growths Head:  no masses, lesions, or tenderness Eyes:  pupils equal and round, sclera anicteric without injection Ears:  canals without lesions, TMs shiny without retraction, no obvious effusion, no erythema Nose:  nares patent, mucosa normal Throat/Pharynx:  lips and gingiva without lesion; tongue and uvula midline; non-inflamed pharynx; no exudates or postnasal drainage Neck: neck supple without adenopathy, thyromegaly, or masses Cardiac: RRR, no bruits, no LE edema Lungs:  clear to auscultation, breath sounds equal bilaterally, no respiratory distress Abdomen: BS+, soft, non-tender, non-distended, no masses or organomegaly noted Rectal: Deferred Musculoskeletal: no ttp over the L thumb; symmetrical muscle groups noted without atrophy or deformity Neuro:  gait normal; deep tendon reflexes normal and symmetric Psych: well oriented with normal range of affect and appropriate judgment/insight  Assessment and Plan  Well  adult exam - Plan: CBC, Comprehensive metabolic panel, Lipid panel  Screening for prostate cancer - Plan: PSA   Well 53 y.o. male. Counseled on diet and exercise. Counseled on risks and benefits of prostate cancer screening with PSA. The patient agrees to undergo testing. Advanced directive form provided today.  Immunizations, labs, and further orders as above. Follow up in 6 mo. The patient voiced understanding and agreement to the plan.  Jilda Roche  Limon, DO 08/02/23 7:40 AM

## 2023-08-02 NOTE — Patient Instructions (Addendum)
Give [place] 2-3 business days to get the results of your labs back.   Keep the diet clean and stay active.  Please get me a copy of your advanced directive form at your convenience.   Send me a message in 1 mo if no better with the thumb.   Let us know if you need anything.  Hand Exercises Hand exercises can be helpful for almost anyone. These exercises can strengthen the hands, improve flexibility and movement, and increase blood flow to the hands. These results can make work and daily tasks easier. Hand exercises can be especially helpful for people who have joint pain from arthritis or have nerve damage from overuse (carpal tunnel syndrome). These exercises can also help people who have injured a hand. Exercises Most of these hand exercises are gentle stretching and motion exercises. It is usually safe to do them often throughout the day. Warming up your hands before exercise may help to reduce stiffness. You can do this with gentle massage or by placing your hands in warm water for 10-15 minutes. It is normal to feel some stretching, pulling, tightness, or mild discomfort as you begin new exercises. This will gradually improve. Stop an exercise right away if you feel sudden, severe pain or your pain gets worse. Ask your health care provider which exercises are best for you. Knuckle bend or "claw" fist Stand or sit with your arm, hand, and all five fingers pointed straight up. Make sure to keep your wrist straight during the exercise. Gently bend your fingers down toward your palm until the tips of your fingers are touching the top of your palm. Keep your big knuckle straight and just bend the small knuckles in your fingers. Hold this position for 3 seconds. Straighten (extend) your fingers back to the starting position. Repeat this exercise 5-10 times with each hand. Full finger fist Stand or sit with your arm, hand, and all five fingers pointed straight up. Make sure to keep your wrist  straight during the exercise. Gently bend your fingers into your palm until the tips of your fingers are touching the middle of your palm. Hold this position for 3 seconds. Extend your fingers back to the starting position, stretching every joint fully. Repeat this exercise 5-10 times with each hand. Straight fist Stand or sit with your arm, hand, and all five fingers pointed straight up. Make sure to keep your wrist straight during the exercise. Gently bend your fingers at the big knuckle, where your fingers meet your hand, and the middle knuckle. Keep the knuckle at the tips of your fingers straight and try to touch the bottom of your palm. Hold this position for 3 seconds. Extend your fingers back to the starting position, stretching every joint fully. Repeat this exercise 5-10 times with each hand. Tabletop Stand or sit with your arm, hand, and all five fingers pointed straight up. Make sure to keep your wrist straight during the exercise. Gently bend your fingers at the big knuckle, where your fingers meet your hand, as far down as you can while keeping the small knuckles in your fingers straight. Think of forming a tabletop with your fingers. Hold this position for 3 seconds. Extend your fingers back to the starting position, stretching every joint fully. Repeat this exercise 5-10 times with each hand. Finger spread Place your hand flat on a table with your palm facing down. Make sure your wrist stays straight as you do this exercise. Spread your fingers and thumb apart  from each other as far as you can until you feel a gentle stretch. Hold this position for 3 seconds. Bring your fingers and thumb tight together again. Hold this position for 3 seconds. Repeat this exercise 5-10 times with each hand. Making circles Stand or sit with your arm, hand, and all five fingers pointed straight up. Make sure to keep your wrist straight during the exercise. Make a circle by touching the tip of  your thumb to the tip of your index finger. Hold for 3 seconds. Then open your hand wide. Repeat this motion with your thumb and each finger on your hand. Repeat this exercise 5-10 times with each hand. Thumb motion Sit with your forearm resting on a table and your wrist straight. Your thumb should be facing up toward the ceiling. Keep your fingers relaxed as you move your thumb. Lift your thumb up as high as you can toward the ceiling. Hold for 3 seconds. Bend your thumb across your palm as far as you can, reaching the tip of your thumb for the small finger (pinkie) side of your palm. Hold for 3 seconds. Repeat this exercise 5-10 times with each hand. Grip strengthening  Hold a stress ball or other soft ball in the middle of your hand. Slowly increase the pressure, squeezing the ball as much as you can without causing pain. Think of bringing the tips of your fingers into the middle of your palm. All of your finger joints should bend when doing this exercise. Hold your squeeze for 3 seconds, then relax. Repeat this exercise 5-10 times with each hand. Contact a health care provider if: Your hand pain or discomfort gets much worse when you do an exercise. Your hand pain or discomfort does not improve within 2 hours after you exercise. If you have any of these problems, stop doing these exercises right away. Do not do them again unless your health care provider says that you can. Get help right away if: You develop sudden, severe hand pain or swelling. If this happens, stop doing these exercises right away. Do not do them again unless your health care provider says that you can. Make sure you discuss any questions you have with your health care provider. Document Revised: 10/13/2018 Document Reviewed: 06/23/2018 Elsevier Patient Education  2020 [employer].

## 2023-09-27 ENCOUNTER — Other Ambulatory Visit (INDEPENDENT_AMBULATORY_CARE_PROVIDER_SITE_OTHER): Payer: BC Managed Care – PPO

## 2023-09-27 ENCOUNTER — Encounter: Payer: Self-pay | Admitting: Family Medicine

## 2023-09-27 DIAGNOSIS — Z125 Encounter for screening for malignant neoplasm of prostate: Secondary | ICD-10-CM | POA: Diagnosis not present

## 2023-09-27 LAB — PSA: PSA: 0.58 ng/mL (ref 0.10–4.00)

## 2023-09-29 DIAGNOSIS — M5416 Radiculopathy, lumbar region: Secondary | ICD-10-CM | POA: Diagnosis not present

## 2023-10-04 ENCOUNTER — Other Ambulatory Visit: Payer: Self-pay | Admitting: Family Medicine

## 2023-12-31 DIAGNOSIS — M5416 Radiculopathy, lumbar region: Secondary | ICD-10-CM | POA: Diagnosis not present

## 2024-01-27 DIAGNOSIS — M5416 Radiculopathy, lumbar region: Secondary | ICD-10-CM | POA: Diagnosis not present

## 2024-01-27 DIAGNOSIS — Z6828 Body mass index (BMI) 28.0-28.9, adult: Secondary | ICD-10-CM | POA: Diagnosis not present

## 2024-01-31 ENCOUNTER — Encounter: Payer: Self-pay | Admitting: Family Medicine

## 2024-01-31 ENCOUNTER — Ambulatory Visit: Payer: BC Managed Care – PPO | Admitting: Family Medicine

## 2024-01-31 ENCOUNTER — Ambulatory Visit: Payer: Self-pay | Admitting: Family Medicine

## 2024-01-31 VITALS — BP 126/74 | HR 100 | Temp 98.0°F | Resp 16 | Ht 73.0 in | Wt 220.0 lb

## 2024-01-31 DIAGNOSIS — E785 Hyperlipidemia, unspecified: Secondary | ICD-10-CM | POA: Insufficient documentation

## 2024-01-31 LAB — LIPID PANEL
Cholesterol: 153 mg/dL (ref 0–200)
HDL: 47.6 mg/dL (ref >39.00)
LDL Cholesterol: 81 mg/dL (ref 0–99)
NonHDL: 105.39
Total CHOL/HDL Ratio: 3
Triglycerides: 120 mg/dL (ref 0.0–149.0)
VLDL: 24 mg/dL (ref 0.0–40.0)

## 2024-01-31 LAB — HEPATIC FUNCTION PANEL
ALT: 33 U/L (ref 0–53)
AST: 27 U/L (ref 0–37)
Albumin: 4.2 g/dL (ref 3.5–5.2)
Alkaline Phosphatase: 44 U/L (ref 39–117)
Bilirubin, Direct: 0.1 mg/dL (ref 0.0–0.3)
Total Bilirubin: 0.6 mg/dL (ref 0.2–1.2)
Total Protein: 6.5 g/dL (ref 6.0–8.3)

## 2024-01-31 MED ORDER — FLUTICASONE PROPIONATE 50 MCG/ACT NA SUSP: 2.0000 | Freq: Every day | NASAL | 3 refills | Status: AC

## 2024-01-31 NOTE — Progress Notes (Signed)
 Chief Complaint  Patient presents with   Follow-up    Follow Up    Subjective: Hyperlipidemia Patient presents for hyperlipidemia follow up. Currently taking Crestor  20 mg/d and compliance with treatment thus far has been good. He denies myalgias. He is adhering to a healthy diet. Exercise: active in yard, swimming No CP or SOB.  The patient is not known to have coexisting coronary artery disease.  Past Medical History:  Diagnosis Date   GERD (gastroesophageal reflux disease)    on omeprazole   Hyperlipidemia    on statin   Migraine     Objective: BP 126/74 (BP Location: Left Arm, Patient Position: Sitting)   Pulse 100   Temp 98 F (36.7 C) (Oral)   Resp 16   Ht 6' 1 (1.854 m)   Wt 220 lb (99.8 kg)   SpO2 96%   BMI 29.03 kg/m  General: Awake, appears stated age HEENT: MMM Heart: RRR, no LE edema, no bruits Lungs: CTAB, no rales, wheezes or rhonchi. No accessory muscle use Psych: Age appropriate judgment and insight, normal affect and mood  Assessment and Plan: Hyperlipidemia, unspecified hyperlipidemia type - Plan: Lipid panel, Hepatic function panel  Chronic, stable.  Continue Crestor  20 mg daily.  Counseled on diet and exercise. F/u in 6 mo. The patient voiced understanding and agreement to the plan.  Mabel [place], DO 01/31/24  8:35 AM

## 2024-01-31 NOTE — Patient Instructions (Signed)
 Give [place] 2-3 business days to get the results of your labs back.   Keep the diet clean and stay active.  Let us know if you need anything.

## 2024-08-02 ENCOUNTER — Ambulatory Visit: Admitting: Family Medicine

## 2024-08-02 ENCOUNTER — Encounter: Payer: Self-pay | Admitting: Family Medicine

## 2024-08-02 VITALS — BP 128/80 | HR 95 | Temp 98.0°F | Resp 16 | Ht 73.0 in | Wt 220.4 lb

## 2024-08-02 DIAGNOSIS — Z23 Encounter for immunization: Secondary | ICD-10-CM

## 2024-08-02 DIAGNOSIS — Z125 Encounter for screening for malignant neoplasm of prostate: Secondary | ICD-10-CM | POA: Diagnosis not present

## 2024-08-02 DIAGNOSIS — Z0000 Encounter for general adult medical examination without abnormal findings: Secondary | ICD-10-CM

## 2024-08-02 LAB — COMPREHENSIVE METABOLIC PANEL WITH GFR
ALT: 48 U/L (ref 3–53)
AST: 26 U/L (ref 5–37)
Albumin: 4.2 g/dL (ref 3.5–5.2)
Alkaline Phosphatase: 49 U/L (ref 39–117)
BUN: 15 mg/dL (ref 6–23)
CO2: 32 meq/L (ref 19–32)
Calcium: 9.1 mg/dL (ref 8.4–10.5)
Chloride: 104 meq/L (ref 96–112)
Creatinine, Ser: 0.94 mg/dL (ref 0.40–1.50)
GFR: 93.46 mL/min
Glucose, Bld: 92 mg/dL (ref 70–99)
Potassium: 4.4 meq/L (ref 3.5–5.1)
Sodium: 142 meq/L (ref 135–145)
Total Bilirubin: 0.4 mg/dL (ref 0.2–1.2)
Total Protein: 7 g/dL (ref 6.0–8.3)

## 2024-08-02 LAB — CBC
HCT: 41 % (ref 39.0–52.0)
Hemoglobin: 14.1 g/dL (ref 13.0–17.0)
MCHC: 34.3 g/dL (ref 30.0–36.0)
MCV: 91.7 fl (ref 78.0–100.0)
Platelets: 231 10*3/uL (ref 150.0–400.0)
RBC: 4.47 Mil/uL (ref 4.22–5.81)
RDW: 12.9 % (ref 11.5–15.5)
WBC: 4.6 10*3/uL (ref 4.0–10.5)

## 2024-08-02 LAB — LIPID PANEL
Cholesterol: 140 mg/dL (ref 28–200)
HDL: 45.7 mg/dL
LDL Cholesterol: 64 mg/dL (ref 10–99)
NonHDL: 93.9
Total CHOL/HDL Ratio: 3
Triglycerides: 152 mg/dL — ABNORMAL HIGH (ref 10.0–149.0)
VLDL: 30.4 mg/dL (ref 0.0–40.0)

## 2024-08-02 LAB — HEPATITIS B SURFACE ANTIBODY, QUANTITATIVE: Hep B S AB Quant (Post): <5 m[IU]/mL — ABNORMAL LOW

## 2024-08-02 NOTE — Patient Instructions (Addendum)
 Give [place] 2-3 business days to get the results of your labs back.   Keep the diet clean and stay active.  Please get me a copy of your advanced directive form at your convenience.   Foods that may reduce pain: 1) Ginger 2) Blueberries 3) Salmon 4) Pumpkin seeds 5) Dark chocolate 6) Turmeric 7) Tart cherries 8) Virgin olive oil 9) Chili peppers 10) Mint 11) Krill oil  Let us know if you need anything.

## 2024-08-02 NOTE — Progress Notes (Signed)
 Chief Complaint  Patient presents with   Annual Exam    CPE    Well Male Cesar Collier [name] is here for a complete physical.   His last physical was >1 year ago.  Current diet: in general, a healthy diet.  Current exercise: some walking, some yard work Weight trend: stable Fatigue out of ordinary? No. Seat belt? Yes.   Advanced directive? No  Health maintenance Shingrix - Yes Colonoscopy- Yes Tetanus- Yes HIV- Yes Hep C- Yes   Past Medical History:  Diagnosis Date   GERD (gastroesophageal reflux disease)    on omeprazole   Hyperlipidemia    on statin   Migraine       Past Surgical History:  Procedure Laterality Date   NO PAST SURGERIES      Medications  Medications Ordered Prior to Encounter[1]   Allergies Allergies[2]  Family History Family History  Problem Relation Age of Onset   Diabetes Father    Hypertension Father    Alcohol abuse Maternal Grandfather    Alzheimer's disease Paternal Grandmother    Heart disease Paternal Grandfather    Hyperlipidemia Paternal Grandfather    Colon polyps Neg Hx    Colon cancer Neg Hx    Esophageal cancer Neg Hx    Rectal cancer Neg Hx    Stomach cancer Neg Hx     Review of Systems: Constitutional:  no fevers Eye:  no recent significant change in vision Ear/Nose/Mouth/Throat:  Ears:  no hearing loss Nose/Mouth/Throat:  no complaints of nasal congestion, no sore throat Cardiovascular:  no chest pain Respiratory:  no shortness of breath Gastrointestinal:  no change in bowel habits GU:  Male: negative for dysuria, frequency Musculoskeletal/Extremities:  no joint pain Integumentary (Skin/Breast):  no abnormal skin lesions reported Neurologic:  no headaches Endocrine: No unexpected weight changes Hematologic/Lymphatic:  no abnormal bleeding  Exam BP 128/80 (BP Location: Left Arm, Patient Position: Sitting)   Pulse 95   Temp 98 F (36.7 C) (Oral)   Resp 16   Ht 6' 1 (1.854 m)   Wt 220 lb 6.4 oz (100 kg)    SpO2 99%   BMI 29.08 kg/m  General:  well developed, well nourished, in no apparent distress Skin:  no significant moles, warts, or growths Head:  no masses, lesions, or tenderness Eyes:  pupils equal and round, sclera anicteric without injection Ears:  canals without lesions, TMs shiny without retraction, no obvious effusion, no erythema Nose:  nares patent, mucosa normal Throat/Pharynx:  lips and gingiva without lesion; tongue and uvula midline; non-inflamed pharynx; no exudates or postnasal drainage Neck: neck supple without adenopathy, thyromegaly, or masses Cardiac: RRR, no bruits, no LE edema Lungs:  clear to auscultation, breath sounds equal bilaterally, no respiratory distress Abdomen: BS+, soft, non-tender, non-distended, no masses or organomegaly noted Rectal: Deferred Musculoskeletal:  symmetrical muscle groups noted without atrophy or deformity Neuro:  gait normal; deep tendon reflexes normal and symmetric Psych: well oriented with normal range of affect and appropriate judgment/insight  Assessment and Plan  Well adult exam - Plan: CBC, Comprehensive metabolic panel with GFR, Lipid panel, Hepatitis B surface antibody,quantitative  Screening for prostate cancer - Plan: PSA  Need for pneumococcal vaccine - Plan: Pneumococcal conjugate vaccine 20-valent (Prevnar 20)   Well 54 y.o. male. Counseled on diet and exercise. Counseled on risks and benefits of prostate cancer screening with PSA. The patient agrees to undergo testing. PCV20 today.  Advanced directive requested today. Immunizations, labs, and further orders as above. Follow  up in 6 mo. The patient voiced understanding and agreement to the plan.  Mabel Mt [place], DO 08/02/24 11:47 AM     [1]  Current Outpatient Medications on File Prior to Visit  Medication Sig Dispense Refill   fluticasone  (FLONASE ) 50 MCG/ACT nasal spray Place 2 sprays into both nostrils daily. 48 g 3   omeprazole (PRILOSEC) 20  MG capsule Take 20 mg by mouth daily.     rosuvastatin  (CRESTOR ) 20 MG tablet TAKE 1 TABLET DAILY 90 tablet 3   No current facility-administered medications on file prior to visit.  [2]  Allergies Allergen Reactions   Codeine Nausea And Vomiting

## 2024-08-03 ENCOUNTER — Ambulatory Visit: Payer: Self-pay | Admitting: Family Medicine

## 2024-08-03 LAB — PSA: PSA: 0.77 ng/mL (ref 0.10–4.00)

## 2025-01-31 ENCOUNTER — Ambulatory Visit: Admitting: Family Medicine
